# Patient Record
Sex: Male | Born: 1965 | Hispanic: No | Marital: Married | Smoking: Never smoker
Health system: Southern US, Community
[De-identification: ages and names within clinical notes are randomized; demographics above are authoritative.]

## PROBLEM LIST (undated history)

## (undated) DIAGNOSIS — C61 Malignant neoplasm of prostate: Secondary | ICD-10-CM

## (undated) HISTORY — PX: OTHER SURGICAL HISTORY: SHX169

---

## 2011-12-13 DIAGNOSIS — J189 Pneumonia, unspecified organism: Secondary | ICD-10-CM | POA: Insufficient documentation

## 2014-11-19 DIAGNOSIS — E785 Hyperlipidemia, unspecified: Secondary | ICD-10-CM | POA: Insufficient documentation

## 2015-01-09 HISTORY — PX: UPPER GASTROINTESTINAL ENDOSCOPY: SHX188

## 2015-01-27 DIAGNOSIS — R5383 Other fatigue: Secondary | ICD-10-CM | POA: Insufficient documentation

## 2015-01-30 DIAGNOSIS — K259 Gastric ulcer, unspecified as acute or chronic, without hemorrhage or perforation: Secondary | ICD-10-CM | POA: Insufficient documentation

## 2015-03-11 DIAGNOSIS — G4730 Sleep apnea, unspecified: Secondary | ICD-10-CM

## 2015-03-11 HISTORY — DX: Sleep apnea, unspecified: G47.30

## 2015-04-01 DIAGNOSIS — G4733 Obstructive sleep apnea (adult) (pediatric): Secondary | ICD-10-CM | POA: Insufficient documentation

## 2015-10-11 DIAGNOSIS — Z87442 Personal history of urinary calculi: Secondary | ICD-10-CM

## 2015-10-11 HISTORY — DX: Personal history of urinary calculi: Z87.442

## 2016-12-08 HISTORY — PX: COLONOSCOPY: SHX174

## 2016-12-26 DIAGNOSIS — N401 Enlarged prostate with lower urinary tract symptoms: Secondary | ICD-10-CM | POA: Insufficient documentation

## 2017-06-08 DIAGNOSIS — M545 Low back pain, unspecified: Secondary | ICD-10-CM | POA: Insufficient documentation

## 2017-10-10 HISTORY — PX: UPPER GASTROINTESTINAL ENDOSCOPY: SHX188

## 2017-11-04 DIAGNOSIS — R109 Unspecified abdominal pain: Secondary | ICD-10-CM | POA: Insufficient documentation

## 2017-11-08 DIAGNOSIS — K8020 Calculus of gallbladder without cholecystitis without obstruction: Secondary | ICD-10-CM | POA: Insufficient documentation

## 2017-11-08 DIAGNOSIS — N182 Chronic kidney disease, stage 2 (mild): Secondary | ICD-10-CM | POA: Insufficient documentation

## 2018-02-07 DIAGNOSIS — G5621 Lesion of ulnar nerve, right upper limb: Secondary | ICD-10-CM | POA: Insufficient documentation

## 2018-08-20 DIAGNOSIS — F5101 Primary insomnia: Secondary | ICD-10-CM | POA: Insufficient documentation

## 2018-08-28 DIAGNOSIS — M5136 Other intervertebral disc degeneration, lumbar region: Secondary | ICD-10-CM | POA: Insufficient documentation

## 2018-10-23 DIAGNOSIS — M25561 Pain in right knee: Secondary | ICD-10-CM | POA: Insufficient documentation

## 2019-11-01 DIAGNOSIS — F321 Major depressive disorder, single episode, moderate: Secondary | ICD-10-CM | POA: Insufficient documentation

## 2019-12-03 DIAGNOSIS — F419 Anxiety disorder, unspecified: Secondary | ICD-10-CM | POA: Insufficient documentation

## 2019-12-06 DIAGNOSIS — I2510 Atherosclerotic heart disease of native coronary artery without angina pectoris: Secondary | ICD-10-CM | POA: Insufficient documentation

## 2020-01-02 DIAGNOSIS — R490 Dysphonia: Secondary | ICD-10-CM | POA: Insufficient documentation

## 2020-01-02 DIAGNOSIS — R634 Abnormal weight loss: Secondary | ICD-10-CM | POA: Insufficient documentation

## 2020-01-02 DIAGNOSIS — R070 Pain in throat: Secondary | ICD-10-CM | POA: Insufficient documentation

## 2020-01-02 DIAGNOSIS — R630 Anorexia: Secondary | ICD-10-CM | POA: Insufficient documentation

## 2020-01-02 DIAGNOSIS — R0989 Other specified symptoms and signs involving the circulatory and respiratory systems: Secondary | ICD-10-CM | POA: Insufficient documentation

## 2020-01-02 DIAGNOSIS — J384 Edema of larynx: Secondary | ICD-10-CM | POA: Insufficient documentation

## 2020-01-02 DIAGNOSIS — R0600 Dyspnea, unspecified: Secondary | ICD-10-CM | POA: Insufficient documentation

## 2020-01-02 DIAGNOSIS — J383 Other diseases of vocal cords: Secondary | ICD-10-CM | POA: Insufficient documentation

## 2020-05-15 DIAGNOSIS — R972 Elevated prostate specific antigen [PSA]: Secondary | ICD-10-CM | POA: Insufficient documentation

## 2021-03-26 ENCOUNTER — Other Ambulatory Visit: Payer: Self-pay

## 2021-03-26 ENCOUNTER — Ambulatory Visit (INDEPENDENT_AMBULATORY_CARE_PROVIDER_SITE_OTHER): Payer: 59 | Admitting: Podiatry

## 2021-03-26 ENCOUNTER — Ambulatory Visit (INDEPENDENT_AMBULATORY_CARE_PROVIDER_SITE_OTHER): Payer: 59

## 2021-03-26 DIAGNOSIS — M795 Residual foreign body in soft tissue: Secondary | ICD-10-CM

## 2021-03-26 DIAGNOSIS — L02612 Cutaneous abscess of left foot: Secondary | ICD-10-CM

## 2021-03-26 DIAGNOSIS — N182 Chronic kidney disease, stage 2 (mild): Secondary | ICD-10-CM

## 2021-03-26 MED ORDER — CIPROFLOXACIN HCL 500 MG PO TABS: 500.0000 mg | ORAL_TABLET | Freq: Two times a day (BID) | ORAL | 0 refills | Status: AC

## 2021-03-26 MED ORDER — DOXYCYCLINE HYCLATE 100 MG PO TABS: 100.0000 mg | ORAL_TABLET | Freq: Two times a day (BID) | ORAL | 0 refills | Status: AC

## 2021-03-31 ENCOUNTER — Encounter: Payer: Self-pay | Admitting: Podiatry

## 2021-03-31 ENCOUNTER — Ambulatory Visit: Payer: 59 | Admitting: Podiatry

## 2021-03-31 NOTE — Progress Notes (Signed)
Subjective:  Patient ID: Samuel Patrick, male    DOB: [birth year],  MRN: [id number]  Chief Complaint  Patient presents with   Foreign Body    Left plantar forefoot foreign body. Pt states duration 1 week, painful to walk. Pt has had a previous dr visit where a piece of glass or plastic was removed.    55 y.o. male presents with the above complaint.  Patient presents with a new complaint of left plantar forefoot foreign body.  Patient states been going for 1 week is painful to walk.  Patient went to urgent care where there was a piece of glass/plastic removed.  No drainage.  It is still very painful and he would like to get it evaluated make sure that there is nothing left in the foot.  He has not been on any antibiotics.  He denies any systemic signs of infection he has not seen any foot and ankle specialist.  He denies any other acute complaints.   Review of Systems: Negative except as noted in the HPI. Denies N/V/F/Ch.  No past medical history on file.  Current Outpatient Medications:    albuterol (VENTOLIN HFA) 108 (90 Base) MCG/ACT inhaler, Inhale into the lungs., Disp: , Rfl:    amoxicillin (AMOXIL) 500 MG tablet, amoxicillin 500 mg tablet, Disp: , Rfl:    budesonide (PULMICORT FLEXHALER) 180 MCG/ACT inhaler, TAKE 1 PUFF BY MOUTH TWICE A DAY, Disp: , Rfl:    ciprofloxacin (CIPRO) 500 MG tablet, Take 1 tablet (500 mg total) by mouth 2 (two) times daily for 14 days., Disp: 28 tablet, Rfl: 0   cyclobenzaprine (FLEXERIL) 10 MG tablet, cyclobenzaprine 10 mg tablet, Disp: , Rfl:    diclofenac (VOLTAREN) 75 MG EC tablet, Take 75 mg by mouth 2 (two) times daily., Disp: , Rfl:    dicyclomine (BENTYL) 20 MG tablet, dicyclomine 20 mg tablet, Disp: , Rfl:    doxepin (SINEQUAN) 10 MG capsule, doxepin 10 mg capsule, Disp: , Rfl:    doxycycline (VIBRA-TABS) 100 MG tablet, Take 1 tablet (100 mg total) by mouth 2 (two) times daily for 14 days., Disp: 28 tablet, Rfl: 0   doxycycline (VIBRAMYCIN) 100 MG  capsule, Take 100 mg by mouth 2 (two) times daily., Disp: , Rfl:    escitalopram (LEXAPRO) 20 MG tablet, Take by mouth., Disp: , Rfl:    famotidine (PEPCID) 20 MG tablet, Take by mouth., Disp: , Rfl:    HYDROcodone-acetaminophen (NORCO/VICODIN) 5-325 MG tablet, hydrocodone 5 mg-acetaminophen 325 mg tablet  TAKE 1 TABLET EVERY 6 HOURS AS NEEDED FOR PAIN, Disp: , Rfl:    ibuprofen (ADVIL) 800 MG tablet, ibuprofen 800 mg tablet, Disp: , Rfl:    lidocaine (XYLOCAINE) 1 % (with preservative) injection, by Infiltration route., Disp: , Rfl:    meloxicam (MOBIC) 15 MG tablet, meloxicam 15 mg tablet, Disp: , Rfl:    Misc. Devices MISC, Start CPAP therapy at 13 cm. water pressure.  CPAP machine with a mask and supplies per patient preference for OSA.  ResMed AirFIt F30i wide full face mask., Disp: , Rfl:    pantoprazole (PROTONIX) 40 MG tablet, Take 1 tablet by mouth daily before breakfast., Disp: , Rfl:    predniSONE (DELTASONE) 10 MG tablet, Take 4 tablets for 3 days, Take 3 tablets for 3 days, Take 2 tablets for 3 days, Take 1 tablet for 3 days, Disp: , Rfl:    sildenafil (VIAGRA) 100 MG tablet, Take by mouth., Disp: , Rfl:    solifenacin (VESICARE) 10  MG tablet, Take 1 tablet by mouth daily., Disp: , Rfl:    Tamsulosin HCl (FLOMAX PO), Flomax, Disp: , Rfl:    triamcinolone acetonide (TRIESENCE) 40 MG/ML SUSP, Inject into the articular space., Disp: , Rfl:    zolpidem (AMBIEN) 10 MG tablet, zolpidem 10 mg tablet, Disp: , Rfl:   Social History   Tobacco Use  Smoking Status Not on file  Smokeless Tobacco Not on file    Allergies  Allergen Reactions   Hydromorphone Itching   Iodinated Diagnostic Agents Itching   Objective:  There were no vitals filed for this visit. There is no height or weight on file to calculate BMI. Constitutional Well developed. Well nourished.  Vascular Dorsalis pedis pulses palpable bilaterally. Posterior tibial pulses palpable bilaterally. Capillary refill normal to  all digits.  No cyanosis or clubbing noted. Pedal hair growth normal.  Neurologic Normal speech. Oriented to person, place, and time. Epicritic sensation to light touch grossly present bilaterally.  Dermatologic Puncture wound noted to the left plantar forefoot.  Pain on palpation to the puncture wound.  No erythema or redness noted.  No purulent drainage noted.  No other clinical signs of infection noted.  However superficial abscess may be clinically appreciated.  Orthopedic: Normal joint ROM without pain or crepitus bilaterally. No visible deformities. No bony tenderness.   Radiographs: 3 views discussion of mature adult left foot: No foreign body noted.  No other objects noted.  No bony abnormalities identified.  No soft tissue emphysema noted. Assessment:   1. Residual foreign body in soft tissue   2. Foot abscess, left   3. Stage 2 chronic kidney disease    Plan:  Patient was evaluated and treated and all questions answered.  Left plantar follow for fluid residual foreign body with underlying history of chronic kidney disease -Explained to the patient the etiology of foreign body and various treatment options were extensively discussed.  Given that there is a possibility he may still have a foreign body left in place and not a complete removal I believe patient will benefit from a procedure of removal of foreign body.  Clinically patient may have a superficial abscess that may have developed at the puncture site wound without being on antibiotics.  His tetanus has been updated. -I will place him on antibiotics doxycycline and Cipro given that the foreign body may have entered through the shoe.  I have asked him to complete the course of both antibiotics he states understanding.   Procedure removal of left foreign body residual -Skin was prepped with Betadine.  One-to-one percent lidocaine plain and quarter percent Marcaine plain was utilized 3 cc to provide local anesthesia.  Using  sharp surgical to, the entry point into the puncture wounds was debrided down.  At this point superficial purulent drainage was noted 1 cc.  No deep purulent drainage noted.  No further remanent foreign body noted.  No other clinical signs of infection noted.  At this time patient wound was dressed with triple antibiotic and a Band-Aid.  I have asked him to continue doing this for next 2 weeks.  He states understanding  No follow-ups on file.

## 2021-04-13 ENCOUNTER — Encounter (HOSPITAL_COMMUNITY): Payer: Self-pay | Admitting: Internal Medicine

## 2021-04-13 ENCOUNTER — Emergency Department (HOSPITAL_COMMUNITY): Payer: 59

## 2021-04-13 ENCOUNTER — Other Ambulatory Visit: Payer: Self-pay

## 2021-04-13 ENCOUNTER — Observation Stay (HOSPITAL_COMMUNITY)
Admission: EM | Admit: 2021-04-13 | Discharge: 2021-04-15 | DRG: 178 | Disposition: A | Discharge status: Home / Self Care | Payer: 59 | Attending: Internal Medicine | Admitting: Internal Medicine

## 2021-04-13 DIAGNOSIS — U071 COVID-19: Principal | ICD-10-CM

## 2021-04-13 DIAGNOSIS — E041 Nontoxic single thyroid nodule: Secondary | ICD-10-CM

## 2021-04-13 DIAGNOSIS — R531 Weakness: Secondary | ICD-10-CM

## 2021-04-13 DIAGNOSIS — K219 Gastro-esophageal reflux disease without esophagitis: Secondary | ICD-10-CM | POA: Diagnosis present

## 2021-04-13 DIAGNOSIS — R0602 Shortness of breath: Secondary | ICD-10-CM | POA: Diagnosis not present

## 2021-04-13 DIAGNOSIS — N182 Chronic kidney disease, stage 2 (mild): Secondary | ICD-10-CM | POA: Insufficient documentation

## 2021-04-13 DIAGNOSIS — N401 Enlarged prostate with lower urinary tract symptoms: Secondary | ICD-10-CM

## 2021-04-13 DIAGNOSIS — R338 Other retention of urine: Secondary | ICD-10-CM

## 2021-04-13 DIAGNOSIS — R0789 Other chest pain: Secondary | ICD-10-CM | POA: Diagnosis present

## 2021-04-13 DIAGNOSIS — G8194 Hemiplegia, unspecified affecting left nondominant side: Secondary | ICD-10-CM | POA: Insufficient documentation

## 2021-04-13 DIAGNOSIS — R079 Chest pain, unspecified: Secondary | ICD-10-CM | POA: Diagnosis present

## 2021-04-13 DIAGNOSIS — K5792 Diverticulitis of intestine, part unspecified, without perforation or abscess without bleeding: Secondary | ICD-10-CM

## 2021-04-13 LAB — BASIC METABOLIC PANEL
Anion gap: 8 (ref 5–15)
BUN: 15 mg/dL (ref 6–20)
CO2: 21 mmol/L — ABNORMAL LOW (ref 22–32)
Calcium: 9.3 mg/dL (ref 8.9–10.3)
Chloride: 109 mmol/L (ref 98–111)
Creatinine, Ser: 1.37 mg/dL — ABNORMAL HIGH (ref 0.61–1.24)
GFR, Estimated: >60 mL/min (ref >60)
Glucose, Bld: 104 mg/dL — ABNORMAL HIGH (ref 70–99)
Potassium: 3.7 mmol/L (ref 3.5–5.1)
Sodium: 138 mmol/L (ref 135–145)

## 2021-04-13 LAB — CBC
HCT: 45.6 % (ref 39.0–52.0)
Hemoglobin: 14 g/dL (ref 13.0–17.0)
MCH: 24.1 pg — ABNORMAL LOW (ref 26.0–34.0)
MCHC: 30.7 g/dL (ref 30.0–36.0)
MCV: 78.6 fL — ABNORMAL LOW (ref 80.0–100.0)
Platelets: 249 10*3/uL (ref 150–400)
RBC: 5.8 MIL/uL (ref 4.22–5.81)
RDW: 15.9 % — ABNORMAL HIGH (ref 11.5–15.5)
WBC: 5.7 10*3/uL (ref 4.0–10.5)
nRBC: 0 % (ref 0.0–0.2)

## 2021-04-13 LAB — PHOSPHORUS: Phosphorus: 3.5 mg/dL (ref 2.5–4.6)

## 2021-04-13 LAB — HEPATIC FUNCTION PANEL
ALT: 22 U/L (ref 0–44)
AST: 20 U/L (ref 15–41)
Albumin: 3.8 g/dL (ref 3.5–5.0)
Alkaline Phosphatase: 66 U/L (ref 38–126)
Bilirubin, Direct: <0.1 mg/dL (ref 0.0–0.2)
Total Bilirubin: 0.8 mg/dL (ref 0.3–1.2)
Total Protein: 7.3 g/dL (ref 6.5–8.1)

## 2021-04-13 LAB — TROPONIN I (HIGH SENSITIVITY)
Troponin I (High Sensitivity): 4 ng/L (ref <18)
Troponin I (High Sensitivity): 3 ng/L (ref <18)

## 2021-04-13 LAB — MAGNESIUM: Magnesium: 2.1 mg/dL (ref 1.7–2.4)

## 2021-04-13 LAB — HIV ANTIBODY (ROUTINE TESTING W REFLEX): HIV Screen 4th Generation wRfx: NONREACTIVE

## 2021-04-13 LAB — SARS CORONAVIRUS 2 (TAT 6-24 HRS): SARS Coronavirus 2: POSITIVE — AB

## 2021-04-13 LAB — LIPASE, BLOOD: Lipase: 35 U/L (ref 11–51)

## 2021-04-13 MED ORDER — NITROGLYCERIN 0.4 MG SL SUBL
0.4000 mg | SUBLINGUAL_TABLET | Freq: Once | SUBLINGUAL | Status: AC
  Administered 2021-04-13: 0.4 mg via SUBLINGUAL
  Filled 2021-04-13: qty 1

## 2021-04-13 MED ORDER — FAMOTIDINE 20 MG PO TABS
10.0000 mg | ORAL_TABLET | Freq: Every day | ORAL | Status: DC
  Administered 2021-04-13 – 2021-04-15 (×3): 10 mg via ORAL
  Filled 2021-04-13 (×3): qty 1

## 2021-04-13 MED ORDER — DIPHENHYDRAMINE HCL 50 MG/ML IJ SOLN
25.0000 mg | Freq: Once | INTRAMUSCULAR | Status: AC
  Administered 2021-04-13: 25 mg via INTRAVENOUS
  Filled 2021-04-13: qty 1

## 2021-04-13 MED ORDER — FENTANYL CITRATE (PF) 100 MCG/2ML IJ SOLN
50.0000 ug | Freq: Once | INTRAMUSCULAR | Status: AC
  Administered 2021-04-13: 50 ug via INTRAVENOUS
  Filled 2021-04-13: qty 2

## 2021-04-13 MED ORDER — POLYETHYLENE GLYCOL 3350 17 G PO PACK: 17.0000 g | PACK | Freq: Every day | ORAL | Status: DC | PRN

## 2021-04-13 MED ORDER — ACETAMINOPHEN 325 MG PO TABS
650.0000 mg | ORAL_TABLET | Freq: Four times a day (QID) | ORAL | Status: DC | PRN
  Administered 2021-04-13 – 2021-04-15 (×5): 650 mg via ORAL
  Filled 2021-04-13 (×5): qty 2

## 2021-04-13 MED ORDER — LIDOCAINE 5 % EX PTCH
1.0000 | MEDICATED_PATCH | CUTANEOUS | Status: DC
  Administered 2021-04-13 – 2021-04-15 (×3): 1 via TRANSDERMAL
  Filled 2021-04-13 (×4): qty 1

## 2021-04-13 MED ORDER — ONDANSETRON HCL 4 MG PO TABS
4.0000 mg | ORAL_TABLET | Freq: Three times a day (TID) | ORAL | Status: DC | PRN
  Administered 2021-04-13 – 2021-04-14 (×2): 4 mg via ORAL
  Filled 2021-04-13 (×2): qty 1

## 2021-04-13 MED ORDER — ALUM & MAG HYDROXIDE-SIMETH 200-200-20 MG/5ML PO SUSP
30.0000 mL | Freq: Once | ORAL | Status: AC
  Administered 2021-04-13: 30 mL via ORAL
  Filled 2021-04-13: qty 30

## 2021-04-13 MED ORDER — ONDANSETRON HCL 4 MG/2ML IJ SOLN
4.0000 mg | Freq: Once | INTRAMUSCULAR | Status: AC
  Administered 2021-04-13: 4 mg via INTRAVENOUS
  Filled 2021-04-13: qty 2

## 2021-04-13 MED ORDER — SODIUM CHLORIDE 0.9 % IV BOLUS
500.0000 mL | Freq: Once | INTRAVENOUS | Status: AC
  Administered 2021-04-13: 500 mL via INTRAVENOUS

## 2021-04-13 MED ORDER — SODIUM CHLORIDE 0.9 % IV SOLN
2.0000 g | Freq: Once | INTRAVENOUS | Status: AC
  Administered 2021-04-13: 2 g via INTRAVENOUS
  Filled 2021-04-13: qty 20

## 2021-04-13 MED ORDER — SODIUM CHLORIDE 0.9 % IV SOLN
12.5000 mg | Freq: Once | INTRAVENOUS | Status: AC
  Administered 2021-04-13: 12.5 mg via INTRAVENOUS
  Filled 2021-04-13: qty 0.5

## 2021-04-13 MED ORDER — IOHEXOL 350 MG/ML SOLN
100.0000 mL | Freq: Once | INTRAVENOUS | Status: AC | PRN
  Administered 2021-04-13: 100 mL via INTRAVENOUS

## 2021-04-13 MED ORDER — ALUM & MAG HYDROXIDE-SIMETH 200-200-20 MG/5ML PO SUSP
30.0000 mL | ORAL | Status: DC | PRN
  Administered 2021-04-13 – 2021-04-15 (×3): 30 mL via ORAL
  Filled 2021-04-13 (×3): qty 30

## 2021-04-13 MED ORDER — METRONIDAZOLE 500 MG/100ML IV SOLN
500.0000 mg | Freq: Once | INTRAVENOUS | Status: AC
  Administered 2021-04-13: 500 mg via INTRAVENOUS
  Filled 2021-04-13: qty 100

## 2021-04-13 MED ORDER — ACETAMINOPHEN 650 MG RE SUPP: 650.0000 mg | Freq: Four times a day (QID) | RECTAL | Status: DC | PRN

## 2021-04-13 MED ORDER — LIDOCAINE VISCOUS HCL 2 % MT SOLN
15.0000 mL | Freq: Once | OROMUCOSAL | Status: AC
  Administered 2021-04-13: 15 mL via ORAL
  Filled 2021-04-13: qty 15

## 2021-04-13 MED ORDER — SODIUM CHLORIDE 0.9% FLUSH
3.0000 mL | Freq: Two times a day (BID) | INTRAVENOUS | Status: DC
  Administered 2021-04-13 – 2021-04-15 (×5): 3 mL via INTRAVENOUS

## 2021-04-13 NOTE — ED Notes (Signed)
Pt requesting pain meds other than Tylenol for 9/10 abd/chest pain. MD IMTs paged.

## 2021-04-13 NOTE — ED Notes (Signed)
Pt report worsening in chest pain that irradiates to back between shoulders. MD and RN notified

## 2021-04-13 NOTE — ED Notes (Signed)
Pt states having extreme chest pain. Rates pain a 10 center of chest to left arm jaw.. pt states feeling anxious. ekg preformed given to Dr. Stevie Kern. Ardine Eng made aware

## 2021-04-13 NOTE — Progress Notes (Signed)
Patient arrived from the ED on a hospital bed, assessment completed see flow sheet placed on tele ccmd notified, patient oriented to room and staff, bed in lowest position call bell within reach will continue to monitor.

## 2021-04-13 NOTE — Discharge Instructions (Addendum)
You were admitted to the hospital for stomach pain and COVID-19 infection. Your stomach pain was found not to be related to your heart and was likely due to having COVID. You developed weakness when you presented here, but the imaging of your head and brain did not reveal findings of a stroke. You had a thyroid nodule that was discovered on imaging, but this was found to be normal and does not require any other workup. We ask that you continue to stay off your Protonix until further discussion with your primary care doctor. The ultrasound of your kidneys was also normal.   For your COVID infection, we ask that you quarantine at home until 04/19/2021. On 04/19/21, we advise that you wear a well-fitting mask when around others or when out in public until 04/24/2021.  Once you recover from your COVID infection after your isolation period, follow-up with your primary care provider.

## 2021-04-13 NOTE — H&P (Addendum)
Date: 04/13/2021               Patient Name:  Samuel Patrick MRN: [id number]  DOB: November 18, 1965 Age / Sex: 55 y.o., male   PCP: Pcp, No         Medical Service: Internal Medicine Teaching Service         Attending Physician: Dr. Reymundo Poll, MD    First Contact: Dr. Alroy Bailiff Pager: [phone]  Second Contact: Thurmon Fair, MD Pager: Cecilie Kicks [phone]         After Hours (After 5p/  First Contact Pager: [phone]  weekends / holidays): Second Contact Pager: [phone]   SUBJECTIVE   Chief Complaint: Atypical chest pain   History of Present Illness: Samuel Patrick is a 55 y.o. male with a pertinent PMH of anxiety, OSA, CKD, benign prostatic hyperplasia, sickle cell trait, who presents to [facility] with no acute onset central chest pain. Patient was concerned that his pain was form his heart, which precipitated his ED visit.  Patient states that he woke up this morning with acute onset epigastric pain and central chest pressure.  Patient states that the pain has been constant since he woke up without any waxing or waning. He has some left neck pain that radiated down the back of his left arm and some associated shortness of breath and dizziness when he got up to use the restroom this morning. He endorses a roughly 1 week history of  mild non-productive cough with significant fatigue without fevers, chills, or recent travel. His wife has been dealing with a "cold" which consists of productive cough and nasal congestion. They have both taken a COVID test daily for the past two that's which were negative. He did however, recently receive antibiotics for a foot infection from his podiatrist.  He has been taking Cipro and doxycycline for the past week without any side effects. He also endorses some nausea, and one episode of bloody emesis this morning. He has notice over the past few months worsening of his GERD symptoms, including epigastric pain with reflux, burning chest pain when he lays down at night, and  decreased po intake. He denies change in his abdominal pain with food, but stats that foods just don't taste the same as they use to. He had an EGD back in 2014 and states it was normal and has not been on any anti-acid meds for the past several months.Furthermore, he denies ETOH or NSAID use or a past history of PUD.  He denies any constipation, hematochezia, or diarrhea and his las colonoscopy was in 2018 which show a single sessile polyp that was removed.   In the ED, patient was hemodynamically stable with persistent chest pain despite nitro, 2x negative troponin, and reassuring EKGs. He had a CTA which did not show any acute cardiopulmonary issues, but did show some possible diverticulitis. As a result the patient was started on ceftriaxone and flagyl and admitted for observation of his chest pain.   Medications: Flomax Zolpidem  Ciprofloxacin  Doxycycline   Past Medical History:  No past medical history on file.  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   OBJECTIVE:   Physical Exam: Blood pressure 120/81, pulse 73, temperature 98.7 F (37.1 C), resp. rate 20, SpO2 98 %. Physical Exam Constitutional:      General: He is in acute distress.     Appearance: He is well-developed. He is obese. He is not ill-appearing or diaphoretic.  HENT:     Head: Normocephalic and atraumatic.     Mouth/Throat:     Mouth: Mucous membranes are dry.     Pharynx: Oropharynx is clear.  Eyes:     Extraocular Movements: Extraocular movements intact.   Cardiovascular:     Rate and Rhythm: Normal rate and regular rhythm.     Pulses: Normal pulses.     Comments: Patient did has faint heart sounds presumably due to body habitus, but no obvious murmur or gallops. Chest wall tenderness to palpation at the costochondral junction. Pulmonary:     Effort: Pulmonary effort is normal. No respiratory distress.     Breath sounds: Normal breath sounds. No wheezing or rales.  Abdominal:     General: Bowel sounds are normal. There is distension.     Palpations: Abdomen is soft. There is no mass.     Tenderness: There is abdominal tenderness (RUQ and epigastric TTP). There is guarding. There is no right CVA tenderness, left CVA tenderness or rebound.     Hernia: No hernia is present.  Musculoskeletal:        General: No swelling (no LE swelling). Normal range of motion.     Cervical back: Normal range of motion. Tenderness (TTP to the left neck without change in rang of motion) present. No rigidity.     Comments: No obvious foot lesions  Skin:    General: Skin is warm and dry.  Neurological:     General: No focal deficit present.     Mental Status: He is alert and oriented to person, place, and time. Mental status is at baseline.  Psychiatric:        Mood and Affect: Mood normal.    Labs: CBC    Component Value Date/Time   WBC 5.7 04/13/2021 0730   RBC 5.80 04/13/2021 0730   HGB 14.0 04/13/2021 0730   HCT 45.6 04/13/2021 0730   PLT 249 04/13/2021 0730   MCV 78.6 (L) 04/13/2021 0730   MCH 24.1 (L) 04/13/2021 0730   MCHC 30.7 04/13/2021 0730   RDW 15.9 (H) 04/13/2021 0730     CMP     Component Value Date/Time   NA 138 04/13/2021 0730   K 3.7 04/13/2021 0730   CL 109 04/13/2021 0730   CO2 21 (L) 04/13/2021 0730   GLUCOSE 104 (H) 04/13/2021 0730   BUN 15 04/13/2021 0730   CREATININE 1.37 (H) 04/13/2021 0730   CALCIUM 9.3 04/13/2021 0730   PROT 7.3 04/13/2021 0730   ALBUMIN 3.8 04/13/2021 0730   AST 20 04/13/2021 0730   ALT 22  04/13/2021 0730   ALKPHOS 66 04/13/2021 0730   BILITOT 0.8 04/13/2021 0730   GFRNONAA >60 04/13/2021 0730    Imaging: DG Chest 2 View 1. Low lung volumes with mild bibasilar atelectasis. No acute cardiopulmonary disease identified.  2. Questionable pulmonary nodule left upper lobe. PA and lateral chest x-ray suggested for further evaluation. Results phoned by me to Dr. Madilyn Hook at 7:52 a.m. on 04/13/2021. Electronically Signed   By: Maisie Fus  Register   On: 04/13/2021 07:52   CT  Angio Chest/Abd/Pel for Dissection W and/or Wo Contrast 1. No evidence for thoracoabdominal aortic aneurysm or dissection. No periaortic edema or hemorrhage to suggest aortic leak.  2. No large central pulmonary embolus.  3. Left colonic diverticulosis with very subtle pericolonic edema/inflammation along the proximal sigmoid colon. Imaging features suggest diverticulitis without perforation or abscess.  4. 2.9 x 2.4 x 2.4 cm soft tissue nodule in the region of the left thoracic inlet, immediately caudal to the left thyroid lobe. Image quality in this region is degraded by beam hardening artifact, but the nodule probably arises from the inferior left thyroid lobe. Recommend thyroid US. (Ref: J Am Coll Radiol. 2015 Feb;12(2): 143-50).  5. Cholelithiasis.  6. Aortic Atherosclerosis   EKG: personally reviewed my interpretation is sinus rhythm with nonspecific/minimal J-point elevation. Has a Q-wave in lead III without previous to compare to.   ASSESSMENT & PLAN:   Active Problems:   Thyroid nodule   Atypical chest pain   Nanine MeansFreddie Guallpa is a 55 y.o. with pertinent PMH of anxiety, OSA, CKD, benign prostatic hyperplasia, sickle cell trait who presented with acute onset chest pain and admit for atypical versus noncardiac chest pain on hospital day 0  #Noncardiac Chest: #Upper GI bleed: #GERD: Patient presents with acute onset chest pressure that is likely atypical cardiac versus noncardiac in origins. His chest pain  started without exertion or emotional disturbance. The pain has been constant since he woke up this morning that did not improve with nitro administration.  Furthermore, patients chest pressure is reproducible with palpation. He does have a history of prediabetes and mild elevation of his LDL at 110, but denies tobacco use or HTN. He does have a family history of heart disease in both of his parents. He has also had associated epigastric pain,1x episode of hematemesis, decreased appetite, with subjective worsening of his underlying GERD symptoms, which raises my concern for gastritis/PUD. Patient states that he had a EGD in 2014 which showed GERD/gastritis but has not been on PPIs for many months. He does endorse fatigue but his Hgb and BUN are WNL. He denies NSAID or ETOH use, which raises my concern for H pylori infection. Likely no emergent need for EGD at this time, but will likely need an out patient EGD in the near future. Alternatively, patient does have some CT scan evidence of possible diverticulitis. This is not consistent with his history of diarrhea or LLQ pain. Therefore, I will discontinue ceftriaxone and flagyl at this time.  - Monitor Hgb for signs of GI bleed - If he has another episode of hematemesis, will consult GI. - Will get stool H. Pylori antigen  - Will start Pepcid and Maalox for abdominal pain  - Will start Zofran for nausea - Continue to trend EKG - repeat EKG tomorrow morning.   #Costochondritis: Patient has chest tenderness to palpation that is likely MSK in nature. Therefore, likely costochondritis.  - Lidocaine patch as needed  #CKD (Stage 2) Has CKD but does not have any acute changes in kidney function  - Avoid nephrotoxic medication when possible.   OSA: He intermittently uses his CPAP. Will continue this will admitted.   Prediabetes: History of prediabetes with A1c of 5.8. He is not currently on medications  - CBG monitoring q morning.   #PAD: #HLD: Recent  CT scan shows aortic atherosclerosis and a total cholesterol of 159 and an LDL of 110 back in 05/2020. ASCVD of 3.2 to 4.6% - Continue to monitor in the OP setting.   #  Thyroid nodule Patient has CT imaging showing a possible thyroid nodule. Recent TSH was within normal limits will repeat TSH during this admission and consider a thyroid US.   #BPH: Patient has a history of BPH on Flomax.  He states that his worsening of his lower urinary tract symptoms including increased urination, dribbling, and frequent nocturia. -Continue Flomax -Get postvoid residual   Diet: Heart Healthy VTE: SCDs IVF: None,None Code: Full  Anticipated Discharge Location: Home Barriers to Discharge: work up   Dispo: Admit patient to Observation with expected length of stay less than 2 midnights.   Chari Manning, D.O.  Internal Medicine Resident, PGY-3 [facility]  Pager: [phone] 2:48 PM, 04/13/2021   Please contact the on call pager after 5 pm and on weekends at [phone].

## 2021-04-13 NOTE — Hospital Course (Addendum)
#  Symptomatic COVID-19 Infection #Hx of GERD and gastritis The pt presented to the ED on 07/05 with acute onset constant chest pressure and epigastric pain. ACS was ruled out initially with negative troponins x3, EKGs without ST changes. Found to be COVID + the night of his admission. Epigastric pain likely related to his COVID infection. This was managed with Tylenol, Maalox, Pepcid, and GI cocktail with lidocaine throughout his stay. Pt was briefly treated with simethicone and PPI, but these were discontinued prior to discharge in the setting of a possible protonix induced AIN.  #Acute onset left-sided weakness, unclear etiology On 07/06, patient endorsed new onset left-sided weakness in his LUE and LLE that started the day prior (07/05) when he was in the ED (he did not remember what time). He also reported new onset dysphagia at that time. Physical exam was remarkable for 4/5 strength in the LUE and 3/5 strength of the LLE as well as decreased sensation in the LUE and LLE. Subsequently initiated stroke work-up. CT head wo contrast and MRI brain were both unremarkable. Unclear etiology of the weakness, but will continue workup in the outpatient setting.   #CKD (Stage 2) #AKI Has CKD stage 2. Nephrotoxic medications were avoided when possible.  On 07/07, the patient developed an AKI. Held protonix and gave fluid challenge.  UA was unremarkable, and therefore had a low suspicion for PPI-induced AIN but held protonix at discharge. Renal ultrasound was ordered, and results were pending at discharge.  Prediabetes: A1c of 5.8. CBG's were monitored q morning throughout his stay.     #Thyroid nodule On the day of his admission, the patient had CT imaging showing a possible left thyroid nodule that was >1 cm in diameter. Repeat TSH obtained was normal. Obtained a thyroid US on 07/06 which was unremarkable with no further work-up needed.   PAD: HLD: Recent CT scan showed aortic atherosclerosis and a  total cholesterol of 159 and an LDL of 110 back in 05/2020. ASCVD of 3.2 to 4.6%. Will continue to monitor in the OP setting.   #BPH: History of BPH and continued on Flomax throughout his hospital stay. Patient initially reported worsening of his lower urinary tract symptoms including increased urination, dribbling, and frequent nocturia. PVR was borderline at 157 mL. Renal US was ordered to see if he was retaining, and these results were pending at discharge.  Patient's home medications were continued for his other comorbidities.

## 2021-04-13 NOTE — ED Notes (Signed)
Attempted report 

## 2021-04-13 NOTE — ED Provider Notes (Signed)
[facility] EMERGENCY DEPARTMENT Provider Note   CSN: [id number] Arrival date & time: 04/13/21  0725     History Chief Complaint  Patient presents with   Chest Pain    Samuel Patrick is a 55 y.o. male.  Presented to ER with concern for chest pain.  Patient reports noted pain this morning upon waking.  No symptoms at all yesterday.  States that his wife noticed that he was very sweaty in bed.  Patient states that he has had constant chest pain radiates to back, left shoulder.  Also having some associated nausea and one episode of vomiting.  Nonbloody bilious.  Also feels somewhat short of breath.  Has not had any significant exertion today.  All occurring at rest.  States both parents were diagnosed with coronary artery disease, unsure exact age of diagnosis.  Has history of CKD.  HPI     No past medical history on file.  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   Pain in right knee 10/23/2018   Lumbar degenerative disc disease 08/28/2018   Primary insomnia 08/20/2018   Neuritis of right ulnar nerve 02/07/2018   Calculus of gallbladder without cholecystitis without obstruction 11/08/2017   Stage 2 chronic kidney disease 11/08/2017   Abdominal pain 11/04/2017   Lumbar back pain 06/08/2017   Benign prostatic hyperplasia with lower urinary tract symptoms 12/26/2016   OSA (obstructive sleep apnea) 04/01/2015   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     No family history on file.     Home Medications Prior to Admission medications   Medication Sig Start Date End Date Taking? Authorizing Provider  albuterol (VENTOLIN HFA) 108 (90 Base) MCG/ACT inhaler Inhale into the lungs. 12/02/19   [provider]  amoxicillin (AMOXIL) 500 MG tablet amoxicillin 500 mg tablet    [provider]  budesonide (PULMICORT FLEXHALER) 180 MCG/ACT inhaler TAKE 1 PUFF BY MOUTH TWICE A DAY 09/10/20   [provider]  cyclobenzaprine (FLEXERIL) 10 MG tablet cyclobenzaprine 10 mg tablet    [provider]  diclofenac (VOLTAREN) 75 MG EC tablet Take 75 mg by mouth 2 (two) times daily. 01/06/21   [provider]  dicyclomine (BENTYL) 20 MG tablet dicyclomine 20 mg tablet 12/15/17   [provider]  doxepin (SINEQUAN) 10 MG capsule doxepin 10 mg capsule 05/18/20   [provider]  doxycycline (VIBRAMYCIN) 100 MG capsule Take 100 mg by mouth 2 (two) times daily. 12/31/20   [provider]  escitalopram (LEXAPRO) 20 MG tablet Take by mouth. 12/02/19   [provider]  famotidine (PEPCID) 20 MG tablet Take by mouth. 08/14/18   [provider]  HYDROcodone-acetaminophen (NORCO/VICODIN) 5-325 MG tablet hydrocodone 5 mg-acetaminophen 325 mg tablet  TAKE 1 TABLET EVERY 6 HOURS AS NEEDED FOR PAIN 12/06/19   [provider]  ibuprofen (ADVIL) 800 MG tablet ibuprofen 800 mg tablet    [provider]  lidocaine (XYLOCAINE) 1 % (with preservative) injection by Infiltration route. 01/06/21   [provider]  meloxicam (MOBIC) 15 MG tablet meloxicam 15 mg tablet    [provider]  Misc.  Devices MISC Start CPAP therapy at 13 cm. water pressure.  CPAP machine with a mask and supplies per patient preference for OSA.  ResMed AirFIt F30i wide full face mask. 03/20/20   [provider]  pantoprazole (PROTONIX) 40 MG tablet Take 1 tablet by mouth daily before breakfast. 10/09/19   [provider]  predniSONE (DELTASONE) 10 MG tablet Take 4 tablets for 3 days, Take 3 tablets for 3 days, Take 2 tablets for 3 days, Take 1 tablet for 3 days 01/01/20   [provider]  sildenafil (VIAGRA) 100 MG tablet Take by mouth. 05/15/20   [provider]  solifenacin (VESICARE) 10 MG tablet Take 1 tablet by mouth daily. 02/19/21   [provider]  Tamsulosin HCl (FLOMAX PO) Flomax    [provider]  triamcinolone acetonide (TRIESENCE) 40 MG/ML SUSP Inject into the articular space. 01/06/21   [provider]  zolpidem (AMBIEN) 10 MG tablet zolpidem 10 mg tablet 04/17/18   [provider]    Allergies    Hydromorphone and Iodinated diagnostic agents  Review of Systems   Review of Systems  Constitutional:  Negative for chills and fever.  HENT:  Negative for ear pain and sore throat.   Eyes:  Negative for pain and visual disturbance.  Respiratory:  Positive for chest tightness and shortness of breath. Negative for cough.   Cardiovascular:  Positive for chest pain. Negative for palpitations.  Gastrointestinal:  Negative for abdominal pain and vomiting.  Genitourinary:  Negative for dysuria and hematuria.  Musculoskeletal:  Positive for back pain. Negative for arthralgias.  Skin:  Negative for color change and rash.  Neurological:  Negative for seizures and syncope.  All other systems reviewed and are negative.  Physical Exam Updated Vital Signs BP (!) 164/84 (BP Location: Right Arm)   Pulse 100   Temp 98.7 F (37.1 C)   Resp 19   SpO2 100%   Physical Exam Vitals and nursing note reviewed.  Constitutional:      Comments:  Appears somewhat uncomfortable secondary to pain but not in frank distress  HENT:     Head: Normocephalic and atraumatic.  Eyes:     Conjunctiva/sclera: Conjunctivae normal.  Cardiovascular:     Rate and Rhythm: Normal rate and regular rhythm.     Heart sounds: No murmur heard. Pulmonary:     Effort: Pulmonary effort is normal. No respiratory distress.     Breath sounds: Normal breath sounds.  Abdominal:     Palpations: Abdomen is soft.     Tenderness: There is no abdominal tenderness.  Musculoskeletal:     Cervical back: Neck supple.     Right lower leg: No edema.     Left lower leg: No edema.  Skin:    General: Skin is warm and dry.     Capillary Refill: Capillary refill takes less than 2 seconds.  Neurological:     General: No focal deficit present.     Mental Status: He is alert.  Psychiatric:        Mood and Affect: Mood normal.  ED Results / Procedures / Treatments   Labs (all labs ordered are listed, but only abnormal results are displayed) Labs Reviewed  BASIC METABOLIC PANEL  CBC  TROPONIN I (HIGH SENSITIVITY)    EKG EKG Interpretation  Date/Time:  Tuesday April 13 2021 07:29:27 EDT Ventricular Rate:  95 PR Interval:  172 QRS Duration: 78 QT Interval:  334 QTC Calculation: 419 R Axis:   87 Text Interpretation: Normal sinus rhythm Nonspecific T wave abnormality Abnormal ECG Confirmed by Marianna Fuss ([id number]) on 04/13/2021 8:07:45 AM  Radiology DG Chest 2 View  Result Date: 04/13/2021 CLINICAL DATA:  Chest pain. EXAM: CHEST - 2 VIEW COMPARISON:  No prior FINDINGS: Mediastinum hilar structures normal. Heart size normal. Low lung volumes with mild bibasilar atelectasis. No focal infiltrate. Questionable tiny nodule left upper lobe. PA and lateral chest x-ray suggested for further evaluation. No pleural effusion or pneumothorax. No acute bony abnormality. IMPRESSION: 1. Low lung volumes with mild bibasilar atelectasis. No acute cardiopulmonary disease  identified. 2. Questionable pulmonary nodule left upper lobe. PA and lateral chest x-ray suggested for further evaluation. Results phoned by me to Dr. Madilyn Hook at 7:52 a.m. on 04/13/2021. Electronically Signed   By: Maisie Fus  Register   On: 04/13/2021 07:52   CT Angio Chest/Abd/Pel for Dissection W and/or Wo Contrast  Result Date: 04/13/2021 CLINICAL DATA:  Severe chest pain radiating to the back. Clinical concern for dissection. EXAM: CT ANGIOGRAPHY CHEST, ABDOMEN AND PELVIS TECHNIQUE: Non-contrast CT of the chest was initially obtained. Multidetector CT imaging through the chest, abdomen and pelvis was performed using the standard protocol during bolus administration of intravenous contrast. Multiplanar reconstructed images and MIPs were obtained and reviewed to evaluate the vascular anatomy. CONTRAST:  OMNIPAQUE IOHEXOL 350 MG/ML SOLN COMPARISON:  None. FINDINGS: CTA CHEST FINDINGS Cardiovascular: The heart size is normal. No substantial pericardial effusion. Coronary artery calcification is evident. Atherosclerotic calcification is noted in the wall of the thoracic aorta. No thoracic aortic aneurysm. Pre contrast imaging shows no hyperdense crescent in the wall of the thoracic aorta to suggest the presence of an acute intramural hematoma. Postcontrast imaging shows no dissection flap in the thoracic aorta. There is no large central pulmonary embolus in the pulmonary outflow tract or main pulmonary arteries. No lobar pulmonary embolus. Mediastinum/Nodes: No mediastinal lymphadenopathy. 2.9 x 2.4 x 2.4 cm soft tissue nodule is identified in the region of the left thoracic inlet, immediately caudal to the left thyroid lobe. Image quality in this region is degraded by beam hardening artifact from contrast material in the left subclavian vein, but the nodule probably arises from the inferior left thyroid lobe. There is no hilar lymphadenopathy. The esophagus has normal imaging features. There is no axillary  lymphadenopathy. Lungs/Pleura: No suspicious pulmonary nodule or mass. Subsegmental atelectasis noted in the lower lobes bilaterally. No focal airspace consolidation. There is no evidence of pleural effusion. Musculoskeletal: No worrisome lytic or sclerotic osseous abnormality. Review of the MIP images confirms the above findings. CTA ABDOMEN AND PELVIS FINDINGS VASCULAR Aorta: Normal caliber aorta without aneurysm, dissection, vasculitis or significant stenosis. Celiac: Patent without evidence of aneurysm, dissection, vasculitis or significant stenosis. SMA: Patent without evidence of aneurysm, dissection, vasculitis or significant stenosis. Renals: Both renal arteries are patent without evidence of aneurysm, dissection, vasculitis, fibromuscular dysplasia or significant stenosis. IMA: Patent without evidence of aneurysm, dissection, vasculitis or significant stenosis. Inflow: Patent without evidence of aneurysm, dissection, vasculitis or significant stenosis. Veins: No obvious venous abnormality within the limitations of this arterial  phase study. Review of the MIP images confirms the above findings. NON-VASCULAR Hepatobiliary: No suspicious focal abnormality within the liver parenchyma. Calcified gallstones evident measuring up to 19 mm. No intrahepatic or extrahepatic biliary dilation. Pancreas: No focal mass lesion. No dilatation of the main duct. No intraparenchymal cyst. No peripancreatic edema. Spleen: No splenomegaly. No focal mass lesion. Adrenals/Urinary Tract: No adrenal nodule or mass. Kidneys unremarkable. No evidence for hydroureter. The urinary bladder appears normal for the degree of distention. Stomach/Bowel: Stomach is unremarkable. No gastric wall thickening. No evidence of outlet obstruction. Duodenum is normally positioned as is the ligament of Treitz. No small bowel wall thickening. No small bowel dilatation. The terminal ileum is normal. The appendix is not well visualized, but there is no  edema or inflammation in the region of the cecum. No gross colonic mass. No colonic wall thickening. Diverticular changes noted left colon. There is some very subtle pericolonic edema/inflammation along the proximal sigmoid colon (images 235-239 of series 6). Lymphatic: There is no gastrohepatic or hepatoduodenal ligament lymphadenopathy. No retroperitoneal or mesenteric lymphadenopathy. No pelvic sidewall lymphadenopathy. Reproductive: Prostate gland is enlarged. Other: No intraperitoneal free fluid. Musculoskeletal: No worrisome lytic or sclerotic osseous abnormality. Review of the MIP images confirms the above findings. IMPRESSION: 1. No evidence for thoracoabdominal aortic aneurysm or dissection. No periaortic edema or hemorrhage to suggest aortic leak. 2. No large central pulmonary embolus. 3. Left colonic diverticulosis with very subtle pericolonic edema/inflammation along the proximal sigmoid colon. Imaging features suggest diverticulitis without perforation or abscess. 4. 2.9 x 2.4 x 2.4 cm soft tissue nodule in the region of the left thoracic inlet, immediately caudal to the left thyroid lobe. Image quality in this region is degraded by beam hardening artifact, but the nodule probably arises from the inferior left thyroid lobe. Recommend thyroid US. (Ref: J Am Coll Radiol. 2015 Feb;12(2): 143-50). 5. Cholelithiasis. 6. Aortic Atherosclerosis (ICD10-I70.0). Electronically Signed   By: Kennith CenterEric  Mansell M.D.   On: 04/13/2021 09:44    Procedures Procedures   Medications Ordered in ED Medications - No data to display  ED Course  I have reviewed the triage vital signs and the nursing notes.  Pertinent labs & imaging results that were available during my care of the patient were reviewed by me and considered in my medical decision making (see chart for details).  Clinical Course as of 04/13/21 1305  Tue Apr 13, 2021  1134 Called lab to check on repeat trop - still in process, will be done shortly [RD]     Clinical Course User Index [RD] Milagros Lollykstra, Maurice Fotheringham S, MD   MDM Rules/Calculators/A&P                         55 year old male presents to ER with concern for chest pain.  On exam patient appears uncomfortable from pain but not in frank distress.  Did have some generalized tenderness palpation in abdomen as well.  EKG without obvious ischemic change, troponin x2 within normal limits, given these findings I have a lower suspicion for acute coronary syndrome at present.  Given the severity of pain, radiation to back, check CTA and ruled out dissection.  Radiologist did comment on concern for diverticulitis.  Given ongoing pain, difficult to control symptoms, believe he would benefit from further observation.  Initiated antibiotics for possible diverticulitis.  Radiologist also incidentally noted thyroid nodule.  Will notify patient.  Discussed case with internal medicine teaching service who will admit patient.  Final  Clinical Impression(s) / ED Diagnoses Final diagnoses:  None    Rx / DC Orders ED Discharge Orders     None        Milagros Loll, MD 04/13/21 1308

## 2021-04-13 NOTE — ED Triage Notes (Signed)
Pt reports sudden onset of central chest pain with radiation to L shoulder since getting out of bed this morning. Endorses emesis x 1, diaphoresis, and shortness of breath. Pt appears very uncomfortable in triage.

## 2021-04-14 ENCOUNTER — Observation Stay (HOSPITAL_COMMUNITY): Payer: 59

## 2021-04-14 ENCOUNTER — Encounter (HOSPITAL_COMMUNITY): Payer: Self-pay | Admitting: Internal Medicine

## 2021-04-14 DIAGNOSIS — E041 Nontoxic single thyroid nodule: Secondary | ICD-10-CM

## 2021-04-14 DIAGNOSIS — K219 Gastro-esophageal reflux disease without esophagitis: Secondary | ICD-10-CM

## 2021-04-14 DIAGNOSIS — R0602 Shortness of breath: Secondary | ICD-10-CM | POA: Diagnosis not present

## 2021-04-14 DIAGNOSIS — U071 COVID-19: Secondary | ICD-10-CM | POA: Diagnosis not present

## 2021-04-14 DIAGNOSIS — R531 Weakness: Secondary | ICD-10-CM

## 2021-04-14 DIAGNOSIS — N182 Chronic kidney disease, stage 2 (mild): Secondary | ICD-10-CM | POA: Diagnosis not present

## 2021-04-14 DIAGNOSIS — G8194 Hemiplegia, unspecified affecting left nondominant side: Secondary | ICD-10-CM | POA: Diagnosis not present

## 2021-04-14 LAB — CBC
HCT: 41.1 % (ref 39.0–52.0)
Hemoglobin: 13 g/dL (ref 13.0–17.0)
MCH: 24.1 pg — ABNORMAL LOW (ref 26.0–34.0)
MCHC: 31.6 g/dL (ref 30.0–36.0)
MCV: 76.3 fL — ABNORMAL LOW (ref 80.0–100.0)
Platelets: 218 10*3/uL (ref 150–400)
RBC: 5.39 MIL/uL (ref 4.22–5.81)
RDW: 15.7 % — ABNORMAL HIGH (ref 11.5–15.5)
WBC: 4.6 10*3/uL (ref 4.0–10.5)
nRBC: 0 % (ref 0.0–0.2)

## 2021-04-14 LAB — COMPREHENSIVE METABOLIC PANEL
ALT: 20 U/L (ref 0–44)
AST: 19 U/L (ref 15–41)
Albumin: 3.3 g/dL — ABNORMAL LOW (ref 3.5–5.0)
Alkaline Phosphatase: 57 U/L (ref 38–126)
Anion gap: 4 — ABNORMAL LOW (ref 5–15)
BUN: 9 mg/dL (ref 6–20)
CO2: 26 mmol/L (ref 22–32)
Calcium: 9 mg/dL (ref 8.9–10.3)
Chloride: 108 mmol/L (ref 98–111)
Creatinine, Ser: 1.49 mg/dL — ABNORMAL HIGH (ref 0.61–1.24)
GFR, Estimated: 55 mL/min — ABNORMAL LOW (ref >60)
Glucose, Bld: 111 mg/dL — ABNORMAL HIGH (ref 70–99)
Potassium: 4 mmol/L (ref 3.5–5.1)
Sodium: 138 mmol/L (ref 135–145)
Total Bilirubin: 0.8 mg/dL (ref 0.3–1.2)
Total Protein: 6.3 g/dL — ABNORMAL LOW (ref 6.5–8.1)

## 2021-04-14 LAB — TROPONIN I (HIGH SENSITIVITY): Troponin I (High Sensitivity): 4 ng/L (ref <18)

## 2021-04-14 LAB — TSH: TSH: 1.74 u[IU]/mL (ref 0.350–4.500)

## 2021-04-14 LAB — LIPASE, BLOOD: Lipase: 42 U/L (ref 11–51)

## 2021-04-14 MED ORDER — SIMETHICONE 80 MG PO CHEW
80.0000 mg | CHEWABLE_TABLET | Freq: Three times a day (TID) | ORAL | Status: DC
  Administered 2021-04-14 (×3): 80 mg via ORAL
  Filled 2021-04-14 (×3): qty 1

## 2021-04-14 MED ORDER — LIDOCAINE VISCOUS HCL 2 % MT SOLN: 15.0000 mL | Freq: Once | OROMUCOSAL | Status: DC

## 2021-04-14 MED ORDER — LORAZEPAM 1 MG PO TABS: 2.0000 mg | ORAL_TABLET | Freq: Once | ORAL | Status: DC

## 2021-04-14 MED ORDER — LORAZEPAM 1 MG PO TABS
1.0000 mg | ORAL_TABLET | Freq: Once | ORAL | Status: AC
  Administered 2021-04-14: 1 mg via ORAL
  Filled 2021-04-14 (×2): qty 1

## 2021-04-14 MED ORDER — ONDANSETRON HCL 4 MG PO TABS: 4.0000 mg | ORAL_TABLET | Freq: Once | ORAL | Status: AC

## 2021-04-14 MED ORDER — HYDROXYZINE HCL 25 MG PO TABS: 25.0000 mg | ORAL_TABLET | ORAL | Status: DC | PRN

## 2021-04-14 MED ORDER — ZOLPIDEM TARTRATE 5 MG PO TABS
10.0000 mg | ORAL_TABLET | Freq: Every day | ORAL | Status: DC
  Filled 2021-04-14: qty 2

## 2021-04-14 MED ORDER — TAMSULOSIN HCL 0.4 MG PO CAPS
0.4000 mg | ORAL_CAPSULE | Freq: Every day | ORAL | Status: DC
  Administered 2021-04-14 – 2021-04-15 (×2): 0.4 mg via ORAL
  Filled 2021-04-14 (×2): qty 1

## 2021-04-14 MED ORDER — LIDOCAINE VISCOUS HCL 2 % MT SOLN
15.0000 mL | Freq: Once | OROMUCOSAL | Status: AC
  Administered 2021-04-14: 15 mL via ORAL
  Filled 2021-04-14: qty 15

## 2021-04-14 MED ORDER — ZOLPIDEM TARTRATE 5 MG PO TABS: 10.0000 mg | ORAL_TABLET | Freq: Every day | ORAL | Status: DC

## 2021-04-14 MED ORDER — TAMSULOSIN HCL 0.4 MG PO CAPS
0.4000 mg | ORAL_CAPSULE | Freq: Every day | ORAL | Status: DC
  Filled 2021-04-14: qty 1

## 2021-04-14 MED ORDER — ADULT MULTIVITAMIN W/MINERALS CH
1.0000 | ORAL_TABLET | Freq: Every day | ORAL | Status: DC
  Administered 2021-04-14 – 2021-04-15 (×2): 1 via ORAL
  Filled 2021-04-14 (×2): qty 1

## 2021-04-14 MED ORDER — PANTOPRAZOLE SODIUM 40 MG PO TBEC
40.0000 mg | DELAYED_RELEASE_TABLET | Freq: Every day | ORAL | Status: DC
  Administered 2021-04-14: 40 mg via ORAL
  Filled 2021-04-14: qty 1

## 2021-04-14 MED ORDER — ALUM & MAG HYDROXIDE-SIMETH 200-200-20 MG/5ML PO SUSP
30.0000 mL | Freq: Once | ORAL | Status: AC
  Administered 2021-04-14: 30 mL via ORAL
  Filled 2021-04-14: qty 30

## 2021-04-14 MED ORDER — ALUM & MAG HYDROXIDE-SIMETH 200-200-20 MG/5ML PO SUSP: 30.0000 mL | Freq: Once | ORAL | Status: DC

## 2021-04-14 MED ORDER — TAMSULOSIN HCL 0.4 MG PO CAPS: 0.4000 mg | ORAL_CAPSULE | Freq: Every day | ORAL | Status: DC

## 2021-04-14 MED ORDER — ZOLPIDEM TARTRATE 5 MG PO TABS
10.0000 mg | ORAL_TABLET | Freq: Every day | ORAL | Status: DC
  Administered 2021-04-14 (×2): 10 mg via ORAL
  Filled 2021-04-14: qty 2

## 2021-04-14 NOTE — Progress Notes (Signed)
RT note: Patient not wearing cpap QHS, COIV+ not in a negative pressure room.

## 2021-04-14 NOTE — Progress Notes (Signed)
Pt states he needs a letter stating he is hospitalized per lawyer because he has a court hearing today. IM resident paged. Awaiting response.  Kenard Gower, RN

## 2021-04-14 NOTE — Progress Notes (Addendum)
Paged at [phone] for increased chest pain, abdominal pain, and nausea.  Patient states that chest pain increased since being transferred from the ED to the floor.  Describes the pain as 10/10.  Earlier today pain radiated to left arm and neck, but now pain is radiating to epigastric area.  States that he has been passing gas and his last bowel movement was yesterday and was normal.  He has noticed increased urine output today.      Patient is COVID +ve.  He has noticed some increased shortness of breath today, but his oxygen saturation was at 99% on room air while I was in the room.   Repeat EKG with sinus rhythm and 1st degree AV Block at that time. Ordered repeat troponin, lipase, hemoglobin, as well as GI cocktail and Zofran PO (QT of 413 on repeat EKG).  Restarted home medications of Ambien and Flomax.

## 2021-04-14 NOTE — Progress Notes (Signed)
Initial Nutrition Assessment  DOCUMENTATION CODES:  Obesity unspecified  INTERVENTION:  Recommend removing carb modified restriction from diet order.  Add Magic cup TID with meals, each supplement provides 290 kcal and 9 grams of protein.  Add MVI with minerals daily.  NUTRITION DIAGNOSIS:  Inadequate oral intake related to decreased appetite, nausea as evidenced by per patient/family report, meal completion < 25%.  GOAL:  Patient will meet greater than or equal to 90% of their needs  MONITOR:  PO intake, Supplement acceptance, Labs, Weight trends, Skin, I & O's  REASON FOR ASSESSMENT:  Malnutrition Screening Tool    ASSESSMENT:  55 yo male with a PMH of anxiety, OSA, CKD, benign prostatic hyperplasia, prediabetes, GERD, and sickle cell trait who presented with acute onset chest pain and admit for atypical versus noncardiac chest pain.  RD working remotely. Pt in CT and out of room during RD's attempt to reach pt by room phone.  Per Epic, pt ate 25% of his dinner last night.  Pt with no weight history in Epic. Pt weighed 106.4 kg on 01/06/21 per Care Everywhere, however. Pt has lost ~9 lbs (4%) in the last 3 months, which is not necessarily significant for the time frame.  Recommend adding Magic Cup TID and MVI with minerals to aid in intake. Recommend removing Carb Mod restriction given that pt does not have diabetes.  Medications: reviewed; Pepcid, Protonix, Zofran PRN (given once today)  Labs: reviewed; Glucose 111 (H)  NUTRITION - FOCUSED PHYSICAL EXAM: Unable to perform  Diet Order:   Diet Order             Diet Heart Room service appropriate? Yes; Fluid consistency: Thin  Diet effective now                  EDUCATION NEEDS:  No education needs have been identified at this time  Skin:  Skin Assessment: Reviewed RN Assessment  Last BM:  04/12/21  Height:  Ht Readings from Last 1 Encounters:  04/13/21 5\' 7"  (1.702 m)   Weight:  Wt Readings from Last  1 Encounters:  04/14/21 102 kg   Ideal Body Weight:  67.3 kg  BMI:  Body mass index is 35.22 kg/m.  Estimated Nutritional Needs:  Kcal:  2000-2200 Protein:  100-115 grams Fluid:  >2 L  06/15/21, RD, LDN (she/her/hers) Registered Dietitian I After-Hours/Weekend Pager # in [phone]

## 2021-04-14 NOTE — Progress Notes (Addendum)
Subjective: Pt was seen at bedside this morning during rounds. He continues to report epigastric pain which has been worsening since he arrived in the ED. Pt states that the GI cocktail he received last night did not alleviate his symptoms. He also endorses continued nausea and loss of appetite but denies any vomiting since his admission. +Dysphagia. Last BM was 07/04. His main concern is new onset left-sided weakness in his LUE and LLE. He says the weakness started yesterday when he was in the ED (does not remember what time).  Pt states that he has had this weakness before. He was followed by Dr. Marisa Sprinkles at [facility] in 2013. Per chart review, the patient initially presented to [facility] in March 2013 with neck pain, numbness, and weakness on his left side. There was a concern for a thalamic stroke, but MRI of the brain was unremarkable. Also obtained MRI C-spine (unremarkable) and had nerve conduction studies of the left arm (unremarkable). Symptoms incompletely explained at the time; he was started on a tapering dose of prednisone. No other records found in regards to further workup.  Objective:  Vital signs in last 24 hours: Vitals:   04/14/21 0100 04/14/21 0200 04/14/21 0500 04/14/21 0900  BP: 120/83 126/76 125/77 135/86  Pulse: 64 70 74 74  Resp:    18  Temp:   (!) 97.3 F (36.3 C)   TempSrc:   Oral Oral  SpO2: 98% 97% 97% 100%  Weight:   102 kg   Height:       Physical Exam: General: Well-developed, well-nourished, in acute distress.  HEENT: Normocephalic and atraumatic Cardiovascular: Normal rate and rhythm, no m/r/g. No LE swelling Respiratory: CTAB, breathing comfortably on room air Abdominal: Soft, flat, mildly distended. Bowel sounds heard throughout. +TTP in RUQ and epigastric areas. +guarding MSK: No LE edema.  Skin: Warm and dry Psych: Normal mood and affect  Neurological Examination Mental Status: Alert, oriented, thought content appropriate. Speech fluent  without evidence of aphasia.  CNs:  II: Visual fields grossly normal,  III,IV, VI: ptosis not present, extra-ocular motions intact bilaterally VII: smile symmetric IX,X: uvula rises symmetrically XII: midline tongue extension Motor: Right : Upper extremity   5/5  Left:     Upper extremity   4/5  Lower extremity   5/5   Lower extremity   3/5 Tone and bulk:normal tone throughout; no atrophy noted   Labs: Results for orders placed or performed during the hospital encounter of 04/13/21 (from the past 24 hour(s))  HIV Antibody (routine testing w rflx)     Status: None   Collection Time: 04/13/21  1:29 PM  Result Value Ref Range   HIV Screen 4th Generation wRfx Non Reactive Non Reactive  Magnesium     Status: None   Collection Time: 04/13/21  1:29 PM  Result Value Ref Range   Magnesium 2.1 1.7 - 2.4 mg/dL  Phosphorus     Status: None   Collection Time: 04/13/21  1:29 PM  Result Value Ref Range   Phosphorus 3.5 2.5 - 4.6 mg/dL  Comprehensive metabolic panel     Status: Abnormal   Collection Time: 04/14/21  1:12 AM  Result Value Ref Range   Sodium 138 135 - 145 mmol/L   Potassium 4.0 3.5 - 5.1 mmol/L   Chloride 108 98 - 111 mmol/L   CO2 26 22 - 32 mmol/L   Glucose, Bld 111 (H) 70 - 99 mg/dL   BUN 9 6 - 20 mg/dL  Creatinine, Ser 1.49 (H) 0.61 - 1.24 mg/dL   Calcium 9.0 8.9 - 88.5 mg/dL   Total Protein 6.3 (L) 6.5 - 8.1 g/dL   Albumin 3.3 (L) 3.5 - 5.0 g/dL   AST 19 15 - 41 U/L   ALT 20 0 - 44 U/L   Alkaline Phosphatase 57 38 - 126 U/L   Total Bilirubin 0.8 0.3 - 1.2 mg/dL   GFR, Estimated 55 (L) >60 mL/min   Anion gap 4 (L) 5 - 15  CBC     Status: Abnormal   Collection Time: 04/14/21  1:12 AM  Result Value Ref Range   WBC 4.6 4.0 - 10.5 K/uL   RBC 5.39 4.22 - 5.81 MIL/uL   Hemoglobin 13.0 13.0 - 17.0 g/dL   HCT 02.7 74.1 - 28.7 %   MCV 76.3 (L) 80.0 - 100.0 fL   MCH 24.1 (L) 26.0 - 34.0 pg   MCHC 31.6 30.0 - 36.0 g/dL   RDW 86.7 (H) 67.2 - 09.4 %   Platelets 218 150 -  400 K/uL   nRBC 0.0 0.0 - 0.2 %  TSH     Status: None   Collection Time: 04/14/21  1:12 AM  Result Value Ref Range   TSH 1.740 0.350 - 4.500 uIU/mL  Troponin I (High Sensitivity)     Status: None   Collection Time: 04/14/21  1:12 AM  Result Value Ref Range   Troponin I (High Sensitivity) 4 <18 ng/L  Lipase, blood     Status: None   Collection Time: 04/14/21  1:12 AM  Result Value Ref Range   Lipase 42 11 - 51 U/L   H pylori stool antigen pending   Imaging: CT Head WO Contrast, 07/06: IMPRESSION: Normal head CT  MRI brain WO Contrast pending    Assessment/Plan:  Samuel Patrick is a 55 y.o. male with pertinent PMH of anxiety, OSA, CKD, benign prostatic hyperplasia, sickle cell trait who presented with acute onset chest pain and subsequently admitted on 04/13/21 for atypical versus noncardiac chest pain.  Active Problems:   GERD (gastroesophageal reflux disease)   Thyroid nodule   Atypical chest pain  #Symptomatic COVID-19 Infection  #Hx of GERD and gastritis The pt presented to the ED on 07/05 with acute onset constant chest pressure and epigastric pain. Unlikely cardiac in nature given negative troponins x3, EKGs without ST changes, and no symptom relief with nitro.  In addition to his epigastric pain, he has also had 1x episode of hematemesis, decreased appetite, with subjective worsening of his underlying GERD symptoms. Patient states that he had a EGD in 2014 which showed GERD/gastritis but has not been on PPIs for many months. No tobacco use, alcohol use, or NSAID use. He was found to be COVID + last night, which could have caused an acute gastritis. Also considering PUD on the differential and have ordered an H pylori stool antigen; however, his labs are not consistent with IDA. He is s/p GI cocktail with lidocaine without relief, so we have added a PPI and simethicone to his regimen and will continue to monitor.  - Monitor Hgb for signs of GI bleed - If he has another  episode of hematemesis, will consult GI. - H. Pylori stool antigen pending - Continue Tylenol, Pepcid regimens for abdominal pain; added PPI and simethicone regimens today. - Continue Zofran for nausea - Consider outpatient EGD in the near future  #Acute onset left-sided weakness Pt reports new onset left-sided weakness in his LUE and  LLE that started yesterday when he was in the ED (he does not remember what time). Denies right-sided weakness. +Dysphagia. He states that he has had this happen to him before in 2013 for which he was followed by Dr. Marisa Sprinkles at [facility]. There was initial suspicion for a thalamic stroke, however imaging studies performed at that time were unremarkable.  On physical exam today, he has 4/5 strength in the LUE and 3/5 strength of the LLE but is otherwise unremarkable. Outside of tPA window. NIH stroke scale 4. We have obtained CT head wo contrast today which was negative so have ordered an MRI brain wo contrast for further evaluation. Will continue to monitor closely.   - MRI brain wo contrast pending  #Costochondritis: Patient noted to have chest tenderness to palpation on prior exam that is likely MSK in nature. Therefore, likely costochondritis. - Lidocaine patch as needed   #CKD (Stage 2) Has CKD but does not have any acute changes in kidney function (GFR <60, Cr stable at 1.49). - Avoid nephrotoxic medication when possible.    OSA: He intermittently uses his CPAP. Will continue this while admitted.    Prediabetes: History of prediabetes with A1c of 5.8. He is not currently on medications - CBG monitoring q morning.    #PAD: #HLD: Recent CT scan shows aortic atherosclerosis and a total cholesterol of 159 and an LDL of 110 back in 05/2020. ASCVD of 3.2 to 4.6% - Continue to monitor in the OP setting.    #Thyroid nodule Patient has CT imaging showing a possible thyroid nodule that is >1 cm in diameter. Repeat TSH was normal. Will obtain thyroid US.  If concerning features on [place], next step would be an FNA (can be done outpatient) - Thyroid US pending   #BPH: Patient has a history of BPH on Flomax. Patient has reported worsening of his lower urinary tract symptoms including increased urination, dribbling, and frequent nocturia. -Continue Flomax regimen -PVR pending -Consider referral to Urology    Anticipated Discharge Location: Home Barriers to Discharge: work-up Dispo: Anticipated discharge in approximately 2-3 day(s).   Fredonia Highland, MD PGY-1 Internal Medicine  04/14/2021, 11:57 AM Pager: [phone] After 5pm on weekdays and 1pm on weekends: On Call pager [phone]

## 2021-04-14 NOTE — Progress Notes (Signed)
Pt is extremely claustro. Was given meds prior to MRI study. He was unable to go through exam. He wants study to be done under anesthesia. I told him that he would have to discuss this with his doctor and he acknowledged his understanding.

## 2021-04-14 NOTE — Progress Notes (Signed)
Masters, DO notified of patient's inability to undergo MRI study due to claustrophobia. Patient was given Lorazepam 1 mg PO prior to leaving the unit for MRI. Master's DO also notified of patient's request for MRI to be done under anesthesia.

## 2021-04-15 ENCOUNTER — Encounter (HOSPITAL_COMMUNITY)
Admission: EM | Disposition: A | Discharge status: Home / Self Care | Payer: Self-pay | Source: Home / Self Care | Attending: Internal Medicine

## 2021-04-15 ENCOUNTER — Inpatient Hospital Stay (HOSPITAL_COMMUNITY): Payer: 59

## 2021-04-15 DIAGNOSIS — R7303 Prediabetes: Secondary | ICD-10-CM

## 2021-04-15 DIAGNOSIS — N189 Chronic kidney disease, unspecified: Secondary | ICD-10-CM

## 2021-04-15 DIAGNOSIS — R079 Chest pain, unspecified: Secondary | ICD-10-CM | POA: Diagnosis present

## 2021-04-15 HISTORY — DX: Chronic kidney disease, unspecified: N18.9

## 2021-04-15 HISTORY — PX: RADIOLOGY WITH ANESTHESIA: SHX6223

## 2021-04-15 HISTORY — DX: Prediabetes: R73.03

## 2021-04-15 LAB — URINALYSIS, COMPLETE (UACMP) WITH MICROSCOPIC
Bacteria, UA: NONE SEEN
Bilirubin Urine: NEGATIVE
Glucose, UA: NEGATIVE mg/dL
Hgb urine dipstick: NEGATIVE
Ketones, ur: NEGATIVE mg/dL
Leukocytes,Ua: NEGATIVE
Nitrite: NEGATIVE
Protein, ur: NEGATIVE mg/dL
Specific Gravity, Urine: 1.011 (ref 1.005–1.030)
pH: 6 (ref 5.0–8.0)

## 2021-04-15 LAB — CBC
HCT: 41 % (ref 39.0–52.0)
Hemoglobin: 12.9 g/dL — ABNORMAL LOW (ref 13.0–17.0)
MCH: 24.1 pg — ABNORMAL LOW (ref 26.0–34.0)
MCHC: 31.5 g/dL (ref 30.0–36.0)
MCV: 76.6 fL — ABNORMAL LOW (ref 80.0–100.0)
Platelets: 212 10*3/uL (ref 150–400)
RBC: 5.35 MIL/uL (ref 4.22–5.81)
RDW: 15.5 % (ref 11.5–15.5)
WBC: 5.2 10*3/uL (ref 4.0–10.5)
nRBC: 0 % (ref 0.0–0.2)

## 2021-04-15 LAB — URINALYSIS, ROUTINE W REFLEX MICROSCOPIC
Bilirubin Urine: NEGATIVE
Glucose, UA: NEGATIVE mg/dL
Hgb urine dipstick: NEGATIVE
Ketones, ur: NEGATIVE mg/dL
Leukocytes,Ua: NEGATIVE
Nitrite: NEGATIVE
Protein, ur: NEGATIVE mg/dL
Specific Gravity, Urine: 1.011 (ref 1.005–1.030)
pH: 6 (ref 5.0–8.0)

## 2021-04-15 LAB — COMPREHENSIVE METABOLIC PANEL
ALT: 20 U/L (ref 0–44)
AST: 18 U/L (ref 15–41)
Albumin: 3.2 g/dL — ABNORMAL LOW (ref 3.5–5.0)
Alkaline Phosphatase: 61 U/L (ref 38–126)
Anion gap: 4 — ABNORMAL LOW (ref 5–15)
BUN: 15 mg/dL (ref 6–20)
CO2: 27 mmol/L (ref 22–32)
Calcium: 8.9 mg/dL (ref 8.9–10.3)
Chloride: 106 mmol/L (ref 98–111)
Creatinine, Ser: 1.75 mg/dL — ABNORMAL HIGH (ref 0.61–1.24)
GFR, Estimated: 46 mL/min — ABNORMAL LOW (ref >60)
Glucose, Bld: 122 mg/dL — ABNORMAL HIGH (ref 70–99)
Potassium: 3.5 mmol/L (ref 3.5–5.1)
Sodium: 137 mmol/L (ref 135–145)
Total Bilirubin: 0.7 mg/dL (ref 0.3–1.2)
Total Protein: 6.1 g/dL — ABNORMAL LOW (ref 6.5–8.1)

## 2021-04-15 LAB — GLUCOSE, CAPILLARY: Glucose-Capillary: 109 mg/dL — ABNORMAL HIGH (ref 70–99)

## 2021-04-15 SURGERY — MRI WITH ANESTHESIA
Anesthesia: Monitor Anesthesia Care

## 2021-04-15 MED ORDER — ALUM & MAG HYDROXIDE-SIMETH 200-200-20 MG/5ML PO SUSP
30.0000 mL | Freq: Three times a day (TID) | ORAL | Status: DC
  Administered 2021-04-15: 30 mL via ORAL
  Filled 2021-04-15: qty 30

## 2021-04-15 MED ORDER — FENTANYL CITRATE (PF) 100 MCG/2ML IJ SOLN
INTRAMUSCULAR | Status: DC | PRN
  Administered 2021-04-15: 25 ug via INTRAVENOUS

## 2021-04-15 MED ORDER — LACTATED RINGERS IV BOLUS
1000.0000 mL | Freq: Once | INTRAVENOUS | Status: AC
  Administered 2021-04-15: 1000 mL via INTRAVENOUS

## 2021-04-15 MED ORDER — LIDOCAINE VISCOUS HCL 2 % MT SOLN
15.0000 mL | Freq: Once | OROMUCOSAL | Status: DC
  Filled 2021-04-15: qty 15

## 2021-04-15 MED ORDER — ENOXAPARIN SODIUM 60 MG/0.6ML IJ SOSY
50.0000 mg | PREFILLED_SYRINGE | INTRAMUSCULAR | Status: DC
  Administered 2021-04-15: 50 mg via SUBCUTANEOUS
  Filled 2021-04-15: qty 0.6

## 2021-04-15 MED ORDER — MIDAZOLAM HCL 2 MG/2ML IJ SOLN
INTRAMUSCULAR | Status: DC | PRN
  Administered 2021-04-15: 2 mg via INTRAVENOUS

## 2021-04-15 MED ORDER — LACTATED RINGERS IV SOLN: INTRAVENOUS | Status: DC | PRN
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
## 2021-04-15 NOTE — Anesthesia Preprocedure Evaluation (Addendum)
Anesthesia Evaluation  Patient identified by MRN, date of birth, ID band Patient awake    Reviewed: Allergy & Precautions, NPO status , Patient's Chart, lab work & pertinent test results  Airway Mallampati: I  TM Distance: >3 FB Neck ROM: Full    Dental no notable dental hx. (+) Dental Advisory Given   Pulmonary shortness of breath, sleep apnea , pneumonia,    Pulmonary exam normal breath sounds clear to auscultation       Cardiovascular + CAD  Normal cardiovascular exam Rhythm:Regular Rate:Normal     Neuro/Psych PSYCHIATRIC DISORDERS Anxiety Depression  Neuromuscular disease    GI/Hepatic Neg liver ROS, PUD, GERD  ,  Endo/Other  negative endocrine ROS  Renal/GU Renal disease     Musculoskeletal  (+) Arthritis ,   Abdominal (+) + obese,   Peds  Hematology  (+) Blood dyscrasia, anemia ,   Anesthesia Other Findings   Reproductive/Obstetrics                            Anesthesia Physical Anesthesia Plan  ASA: 2  Anesthesia Plan: MAC   Post-op Pain Management:    Induction: Intravenous  PONV Risk Score and Plan: Ondansetron, Dexamethasone, Treatment may vary due to age or medical condition, Propofol infusion and Midazolam  Airway Management Planned: Natural Airway  Additional Equipment:   Intra-op Plan:   Post-operative Plan:   Informed Consent: I have reviewed the patients History and Physical, chart, labs and discussed the procedure including the risks, benefits and alternatives for the proposed anesthesia with the patient or authorized representative who has indicated his/her understanding and acceptance.     Dental advisory given  Plan Discussed with: CRNA  Anesthesia Plan Comments: (Discussed possible GA)      Anesthesia Quick Evaluation

## 2021-04-15 NOTE — Progress Notes (Signed)
Patient given discharge instructions. PIV removed. Telemetry box removed, CCMD notified. Personal belongings packed up and taken with patient to vehicle in wheelchair.  Kenard Gower, RN

## 2021-04-15 NOTE — Discharge Summary (Signed)
Name: Samuel Patrick MRN: [id number] DOB: 1966/08/26 55 y.o. PCP: Dr. Roe Rutherford, MD  Date of Admission: 04/13/2021  7:27 AM Date of Discharge: 04/15/2021 Attending Physician: Reymundo Poll, MD  Discharge Diagnosis: 1. COVID infection with GI predominant symptoms 2. Acute onset left-sided weakness of unclear etiology   Discharge Medications: Allergies as of 04/15/2021       Reactions   Hydromorphone Itching   Iodinated Diagnostic Agents Itching        Medication List     TAKE these medications    ciprofloxacin 500 MG tablet Commonly known as: CIPRO Take 500 mg by mouth 2 (two) times daily.   tamsulosin 0.4 MG Caps capsule Commonly known as: FLOMAX Take 0.4 mg by mouth daily.   zolpidem 10 MG tablet Commonly known as: AMBIEN Take 10 mg by mouth at bedtime.        Disposition and follow-up:   Samuel Patrick was discharged from [facility] in Stable condition. At the hospital follow up visit please address:  1.  COVID-19 infection with GI predominant symptoms: Follow-up improvement of symptoms with supportive care and pain medication.   2. Weakness of unclear etiology: Pt had acute onset left-sided weakness when he presented to the ED. Stroke workup including CT head and MRI brain was unremarkable. Unclear etiology for his weakness. With his renal dysfunction and chronic symptoms (muscle and joint pains per PCP notes), may consider autoimmune or connective tissue diseases.   3. AKI: Pt had bump of Cr from 1.37 to 1.75 in a 48 hour period. On further questioning, patient reports his Protonix was discontinued a few months ago by his urologist due to worsening kidney function. On chart review in care everywhere, he was seen by his PCP on 02/17/21 for an AKI and persistent renal dysfunction. He was instructed to discontinue diclofenac, which had been started three months prior, as well as his protonix which he had been taking for the  past year. He followed up with his urologist that same day and was treated for prostatitis with ciprofloxacin. He was seen by urology again on 03/17/2021. Symptoms of prostatitis had resolved unfortunately his renal function had not improved. Plan was for nephrology referral at his next visit if GFR was still reduced.  During his hospitalization here, we gave the patient a fluid challenge, ordered a UA and renal ultrasound (both normal), and held any offending medications. Given his normal UA, we have low suspicion for protonix-induced AKI but continued to hold his protonix at discharge.  4. Labs / imaging needed at time of follow-up: None  5.  Pending labs/ test needing follow-up: Patient left when renal ultrasound results returned, but these were found to be normal.   Follow-up Appointments:  Hospital Course by problem list: Symptomatic COVID-19 Infection, Hx of GERD and gastritis The pt presented to the ED on 07/05 with acute onset constant chest pressure and epigastric pain. ACS was ruled out initially with negative troponins x3, EKGs without ST changes. Found to be COVID + the night of his admission. Epigastric pain likely related to his COVID infection. This was managed with Tylenol, Maalox, Pepcid, and GI cocktail with lidocaine throughout his stay. Pt was briefly treated with simethicone and PPI, but these were discontinued prior to discharge in the setting of a possible protonix induced AIN.  Acute onset left-sided weakness, unclear etiology On 07/06, patient endorsed new onset left-sided weakness in his LUE and LLE that started the day prior (07/05) when he  was in the ED (he did not remember exactly what time). He also reported new onset dysphagia at that time. Physical exam was remarkable for 4/5 strength in the LUE and 3/5 strength of the LLE as well as decreased sensation in the LUE and LLE. We initiated stroke work-up. CT head wo contrast and MRI brain were both unremarkable. Unclear  etiology of the weakness, but will continue workup in the outpatient setting.  CKD (Stage 2), AKI Has CKD stage 2. Nephrotoxic medications were avoided when possible.  On 07/07, the patient developed an AKI. Held protonix and gave fluid challenge.  UA was unremarkable, and therefore had a low suspicion for PPI-induced AIN but held protonix at discharge. Renal ultrasound was normal.  Prediabetes A1c of 5.8. CBG's were monitored q morning throughout his stay.    Thyroid nodule On the day of his admission, the patient had CT imaging showing a possible left thyroid nodule that was >1 cm in diameter. Repeat TSH obtained was normal. Obtained a thyroid US on 07/06 which was unremarkable with no further work-up needed.   6. PAD, HLD Recent CT scan showed aortic atherosclerosis and a total cholesterol of 159 and an LDL of 110 back in 05/2020. ASCVD of 3.2 to 4.6%. Will continue to monitor in the OP setting.   7. BPH History of BPH and continued on Flomax throughout his hospital stay. Patient initially reported worsening of his lower urinary tract symptoms including increased urination, dribbling, and frequent nocturia. PVR was borderline at 157 mL. Renal US was normal.  8. Costochondritis His costochondritis was adequately controlled with a lidocaine patch throughout his hospital stay.    Discharge Exam:   BP 117/77 (BP Location: Right Arm)   Pulse 67   Temp 98.6 F (37 C) (Oral)   Resp 19   Ht  (1.702 m)   Wt 102.5 kg   SpO2 100%   BMI 35.38 kg/m  Discharge exam:  General: Well-developed, well-nourished, NAD HEENT: Normocephalic and atraumatic Cardiovascular: Normal rate and rhythm, no m/r/g. No LE swelling Respiratory: CTAB, breathing comfortably on room air Abdominal: Soft, non-distended. Bowel sounds heard throughout. +TTP in RUQ and epigastric areas. MSK: No LE edema. Skin: Warm and dry Psych: Normal mood and affect   Neurological Examination Motor: Right :  Upper  extremity   5/5              Left:     Upper extremity   4/5             Lower extremity   5/5                          Lower extremity   3/5 Tone and bulk:normal tone throughout; no atrophy noted Sensory: Decreased sensation to light touch in LUE and LLE. Sensation intact in RUE and RLE.  Cerebellar: normal finger-to-nose  Pertinent Labs, Studies, and Procedures:  DG Chest 2 View  Result Date: 04/13/2021 CLINICAL DATA:  Chest pain. EXAM: CHEST - 2 VIEW COMPARISON:  No prior FINDINGS: Mediastinum hilar structures normal. Heart size normal. Low lung volumes with mild bibasilar atelectasis. No focal infiltrate. Questionable tiny nodule left upper lobe. PA and lateral chest x-ray suggested for further evaluation. No pleural effusion or pneumothorax. No acute bony abnormality. IMPRESSION: 1. Low lung volumes with mild bibasilar atelectasis. No acute cardiopulmonary disease identified. 2. Questionable pulmonary nodule left upper lobe. PA and lateral chest x-ray suggested for further evaluation. Results  phoned by me to Dr. Madilyn Hook at 7:52 a.m. on 04/13/2021. Electronically Signed   By: Maisie Fus  Register   On: 04/13/2021 07:52   CT HEAD WO CONTRAST  Result Date: 04/14/2021 CLINICAL DATA:  Acute stroke presentation. Specific deficit not described. EXAM: CT HEAD WITHOUT CONTRAST TECHNIQUE: Contiguous axial images were obtained from the base of the skull through the vertex without intravenous contrast. COMPARISON:  None. FINDINGS: Brain: The brain shows a normal appearance without evidence of malformation, atrophy, old or acute small or large vessel infarction, mass lesion, hemorrhage, hydrocephalus or extra-axial collection. Vascular: No hyperdense vessel. No evidence of atherosclerotic calcification. Skull: Normal.  No traumatic finding.  No focal bone lesion. Sinuses/Orbits: Sinuses are clear. Orbits appear normal. Mastoids are clear. Other: None significant IMPRESSION: Normal head CT. Electronically Signed   By:  Paulina Fusi M.D.   On: 04/14/2021 10:23   US THYROID  Result Date: 04/15/2021 CLINICAL DATA:  Incidental on CT. EXAM: THYROID ULTRASOUND TECHNIQUE: Ultrasound examination of the thyroid gland and adjacent soft tissues was performed. COMPARISON:  None. FINDINGS: Parenchymal Echotexture: Normal Isthmus: 0.5 cm Right lobe: 4.3 x 2.2 x 2.2 cm Left lobe: 4.5 x 2.1 x 2.4 cm _________________________________________________________ Estimated total number of nodules >/= 1 cm: 2 Number of spongiform nodules >/=  2 cm not described below (TR1): 0 Number of mixed cystic and solid nodules >/= 1.5 cm not described below (TR2): 0 _________________________________________________________ Small isthmic and left-sided thyroid nodules are noted incidentally. The isthmic nodule is isoechoic and less than 1.5 cm in size and therefore does not meet criteria for further evaluation. The left mid thyroid nodule is spongiform in morphology which is considered benign and also does not require further evaluation. No nodules of consequence or suspicious features. IMPRESSION: Incidental note is made of 2 small benign/low risk thyroid nodules which do not meet criteria to warrant biopsy or dedicated imaging surveillance. No further follow-up necessary. The above is in keeping with the ACR TI-RADS recommendations - J Am Coll Radiol 2017;14:587-595. Electronically Signed   By: Malachy Moan M.D.   On: 04/15/2021 07:45   CT Angio Chest/Abd/Pel for Dissection W and/or Wo Contrast  Result Date: 04/13/2021 CLINICAL DATA:  Severe chest pain radiating to the back. Clinical concern for dissection. EXAM: CT ANGIOGRAPHY CHEST, ABDOMEN AND PELVIS TECHNIQUE: Non-contrast CT of the chest was initially obtained. Multidetector CT imaging through the chest, abdomen and pelvis was performed using the standard protocol during bolus administration of intravenous contrast. Multiplanar reconstructed images and MIPs were obtained and reviewed to evaluate  the vascular anatomy. CONTRAST:  OMNIPAQUE IOHEXOL 350 MG/ML SOLN COMPARISON:  None. FINDINGS: CTA CHEST FINDINGS Cardiovascular: The heart size is normal. No substantial pericardial effusion. Coronary artery calcification is evident. Atherosclerotic calcification is noted in the wall of the thoracic aorta. No thoracic aortic aneurysm. Pre contrast imaging shows no hyperdense crescent in the wall of the thoracic aorta to suggest the presence of an acute intramural hematoma. Postcontrast imaging shows no dissection flap in the thoracic aorta. There is no large central pulmonary embolus in the pulmonary outflow tract or main pulmonary arteries. No lobar pulmonary embolus. Mediastinum/Nodes: No mediastinal lymphadenopathy. 2.9 x 2.4 x 2.4 cm soft tissue nodule is identified in the region of the left thoracic inlet, immediately caudal to the left thyroid lobe. Image quality in this region is degraded by beam hardening artifact from contrast material in the left subclavian vein, but the nodule probably arises from the inferior left thyroid lobe. There  is no hilar lymphadenopathy. The esophagus has normal imaging features. There is no axillary lymphadenopathy. Lungs/Pleura: No suspicious pulmonary nodule or mass. Subsegmental atelectasis noted in the lower lobes bilaterally. No focal airspace consolidation. There is no evidence of pleural effusion. Musculoskeletal: No worrisome lytic or sclerotic osseous abnormality. Review of the MIP images confirms the above findings. CTA ABDOMEN AND PELVIS FINDINGS VASCULAR Aorta: Normal caliber aorta without aneurysm, dissection, vasculitis or significant stenosis. Celiac: Patent without evidence of aneurysm, dissection, vasculitis or significant stenosis. SMA: Patent without evidence of aneurysm, dissection, vasculitis or significant stenosis. Renals: Both renal arteries are patent without evidence of aneurysm, dissection, vasculitis, fibromuscular dysplasia or significant  stenosis. IMA: Patent without evidence of aneurysm, dissection, vasculitis or significant stenosis. Inflow: Patent without evidence of aneurysm, dissection, vasculitis or significant stenosis. Veins: No obvious venous abnormality within the limitations of this arterial phase study. Review of the MIP images confirms the above findings. NON-VASCULAR Hepatobiliary: No suspicious focal abnormality within the liver parenchyma. Calcified gallstones evident measuring up to 19 mm. No intrahepatic or extrahepatic biliary dilation. Pancreas: No focal mass lesion. No dilatation of the main duct. No intraparenchymal cyst. No peripancreatic edema. Spleen: No splenomegaly. No focal mass lesion. Adrenals/Urinary Tract: No adrenal nodule or mass. Kidneys unremarkable. No evidence for hydroureter. The urinary bladder appears normal for the degree of distention. Stomach/Bowel: Stomach is unremarkable. No gastric wall thickening. No evidence of outlet obstruction. Duodenum is normally positioned as is the ligament of Treitz. No small bowel wall thickening. No small bowel dilatation. The terminal ileum is normal. The appendix is not well visualized, but there is no edema or inflammation in the region of the cecum. No gross colonic mass. No colonic wall thickening. Diverticular changes noted left colon. There is some very subtle pericolonic edema/inflammation along the proximal sigmoid colon (images 235-239 of series 6). Lymphatic: There is no gastrohepatic or hepatoduodenal ligament lymphadenopathy. No retroperitoneal or mesenteric lymphadenopathy. No pelvic sidewall lymphadenopathy. Reproductive: Prostate gland is enlarged. Other: No intraperitoneal free fluid. Musculoskeletal: No worrisome lytic or sclerotic osseous abnormality. Review of the MIP images confirms the above findings. IMPRESSION: 1. No evidence for thoracoabdominal aortic aneurysm or dissection. No periaortic edema or hemorrhage to suggest aortic leak. 2. No large  central pulmonary embolus. 3. Left colonic diverticulosis with very subtle pericolonic edema/inflammation along the proximal sigmoid colon. Imaging features suggest diverticulitis without perforation or abscess. 4. 2.9 x 2.4 x 2.4 cm soft tissue nodule in the region of the left thoracic inlet, immediately caudal to the left thyroid lobe. Image quality in this region is degraded by beam hardening artifact, but the nodule probably arises from the inferior left thyroid lobe. Recommend thyroid US. (Ref: J Am Coll Radiol. 2015 Feb;12(2): 143-50). 5. Cholelithiasis. 6. Aortic Atherosclerosis (ICD10-I70.0). Electronically Signed   By: Kennith Center M.D.   On: 04/13/2021 09:44     Renal Ultrasound, 04/15/2021: CLINICAL DATA:  Urinary retention due to BPH. EXAM: RENAL / URINARY TRACT ULTRASOUND COMPLETE COMPARISON:  CT 04/13/2021 FINDINGS: Right Kidney: Renal measurements: 10.2 x 4.8 x 4.8 cm = volume: 123 mL. Echogenicity within normal limits. No hydronephrosis. No visualized stone or focal lesion. Left Kidney: Renal measurements: 11.4 x 4.7 x 4.4 cm = volume: 122 mL. Echogenicity within normal limits. No hydronephrosis. No visualized stone or focal lesion. Bladder: Appears normal for degree of bladder distention. Both ureteral jets are seen. Other: Enlarged prostate measuring 5.7 x 4.4 x 4.3 cm cause of nodular projection into the bladder base.  IMPRESSION: 1.  Unremarkable sonographic appearance of the kidneys and bladder. 2. Enlarged prostate gland causing projection into the bladder base.Electronically Signed   By: Narda RutherfordMelanie  Sanford M.D.   On: 04/15/2021 16:40     CBC Latest Ref Rng & Units 04/15/2021 04/14/2021 04/13/2021  WBC 4.0 - 10.5 K/uL 5.2 4.6 5.7  Hemoglobin 13.0 - 17.0 g/dL 12.9(L) 13.0 14.0  Hematocrit 39.0 - 52.0 % 41.0 41.1 45.6  Platelets 150 - 400 K/uL 212 218 249   CMP Latest Ref Rng & Units 04/15/2021 04/14/2021 04/13/2021  Glucose 70 - 99 mg/dL 409(W122(H) 119(J111(H) 478(G104(H)  BUN 6 - 20  mg/dL 15 9 15   Creatinine 0.61 - 1.24 mg/dL 9.56(O1.75(H) 1.30(Q1.49(H) 6.57(Q1.37(H)  Sodium 135 - 145 mmol/L 137 138 138  Potassium 3.5 - 5.1 mmol/L 3.5 4.0 3.7  Chloride 98 - 111 mmol/L 106 108 109  CO2 22 - 32 mmol/L 27 26 21(L)  Calcium 8.9 - 10.3 mg/dL 8.9 9.0 9.3  Total Protein 6.5 - 8.1 g/dL 6.1(L) 6.3(L) 7.3  Total Bilirubin 0.3 - 1.2 mg/dL 0.7 0.8 0.8  Alkaline Phos 38 - 126 U/L 61 57 66  AST 15 - 41 U/L 18 19 20   ALT 0 - 44 U/L 20 20 22     Urine dipstick shows negative for all components.  Micro exam: negative for WBC's or RBC's.     Component Ref Range & Units 1 d ago  (04/14/21) 2 d ago  (04/13/21) 2 d ago  (04/13/21)  Troponin I (High Sensitivity) <18 ng/L 4  3 CM  4 CM      Signed: Fredonia HighlandMarianne Bonanno, MD 04/15/2021, 5:31 PM   Pager: [phone]

## 2021-04-15 NOTE — Progress Notes (Signed)
Bladder scan performed showing . Pt tolerated well. Results documented in flowsheets.  Kenard Gower, RN

## 2021-04-15 NOTE — Transfer of Care (Signed)
Immediate Anesthesia Transfer of Care Note  Patient: Samuel Patrick  Procedure(s) Performed: MRI WITH ANESTHESIA OF BRAIN W/O CONSTRAST  Patient Location: PACU  Anesthesia Type:MAC  Level of Consciousness: awake, alert  and oriented  Airway & Oxygen Therapy: Patient Spontanous Breathing  Post-op Assessment: Report given to RN and Post -op Vital signs reviewed and stable  Post vital signs: Reviewed and stable  Last Vitals:  Vitals Value Taken Time  BP    Temp    Pulse    Resp    SpO2      Last Pain:  Vitals:   04/15/21 0811  TempSrc: Oral  PainSc:          Complications: No notable events documented.

## 2021-04-15 NOTE — Progress Notes (Signed)
Pt received from MRI. VSS. Call light in reach.  Versie Starks, RN'

## 2021-04-15 NOTE — Anesthesia Procedure Notes (Signed)
Procedure Name: MAC Date/Time: 04/15/2021 12:35 PM Performed by: Trinna Post., CRNA Pre-anesthesia Checklist: Patient identified, Patient being monitored, Emergency Drugs available, Timeout performed and Suction available Oxygen Delivery Method: Nasal cannula Placement Confirmation: positive ETCO2

## 2021-04-15 NOTE — Progress Notes (Signed)
ANTICOAGULATION CONSULT NOTE - Initial Consult  Pharmacy Consult for Lovenox Indication: VTE prophylaxis  Allergies  Allergen Reactions   Hydromorphone Itching   Iodinated Diagnostic Agents Itching    Patient Measurements: Height: 5\' 7"  (170.2 cm) Weight: 102.5 kg (225 lb 14.4 oz) IBW/kg (Calculated) : 66.1  Vital Signs: Temp: 98.3 F (36.8 C) (07/07 0353) Temp Source: Oral (07/07 0353) BP: 125/81 (07/07 0353) Pulse Rate: 76 (07/07 0353)  Labs: Recent Labs    04/13/21 0730 04/13/21 0933 04/14/21 0112 04/15/21 0211  HGB 14.0  --  13.0 12.9*  HCT 45.6  --  41.1 41.0  PLT 249  --  218 212  CREATININE 1.37*  --  1.49* 1.75*  TROPONINIHS 4 3 4   --     Estimated Creatinine Clearance: 55.1 mL/min (A) (by C-G formula based on SCr of 1.75 mg/dL (H)).   Medical History: History reviewed. No pertinent past medical history.    Assessment: 55 y.o. M presents with COVID-19. Pharmacy consulted to dose Lovenox for VTE prophylaxis. BMI 35. CBC stable on admission.  Goal of Therapy:  Prevention of VTE Monitor platelets by anticoagulation protocol: Yes   Plan:  Lovenox 50mg  (0.5mg /kg) q24h Pharmacy will sign off - please reconsult if needed  , PharmD, BCPS Please see amion for complete clinical pharmacist phone list 04/15/2021,5:00 AM

## 2021-04-15 NOTE — Progress Notes (Signed)
SLP Cancellation Note  Patient Details Name: Samuel Patrick MRN: [id number] DOB: [birth year]   Cancelled treatment:       Reason Eval/Treat Not Completed: Patient at procedure or test/unavailable; pt NPO for MRI.  ST will f/u when schedule allows.     Tressie Stalker, M.S., CCC-SLP 04/15/2021, 12:22 PM

## 2021-04-15 NOTE — Progress Notes (Signed)
PVR 157 cc.

## 2021-04-15 NOTE — Anesthesia Postprocedure Evaluation (Signed)
Anesthesia Post Note  Patient: Samuel Patrick  Procedure(s) Performed: MRI WITH ANESTHESIA OF BRAIN W/O CONSTRAST     Patient location during evaluation: PACU Anesthesia Type: MAC Level of consciousness: awake and alert Pain management: pain level controlled Vital Signs Assessment: post-procedure vital signs reviewed and stable Respiratory status: spontaneous breathing Cardiovascular status: stable Anesthetic complications: no   No notable events documented.  Last Vitals:  Vitals:   04/15/21 1322 04/15/21 1338  BP: 118/85 117/77  Pulse:  67  Resp: 18 19  Temp:  37 C  SpO2: 98% 100%    Last Pain:  Vitals:   04/15/21 1338  TempSrc: Oral  PainSc:                  Nolon Nations

## 2021-04-16 ENCOUNTER — Encounter (HOSPITAL_COMMUNITY): Payer: Self-pay | Admitting: Radiology

## 2021-05-12 DIAGNOSIS — F419 Anxiety disorder, unspecified: Secondary | ICD-10-CM

## 2021-05-12 DIAGNOSIS — F32A Depression, unspecified: Secondary | ICD-10-CM

## 2021-05-12 HISTORY — DX: Anxiety disorder, unspecified: F41.9

## 2021-05-12 HISTORY — DX: Depression, unspecified: F32.A

## 2022-07-10 IMAGING — MR MR HEAD W/O CM
6 of 11 series · 26 of 48 positions shown · non-contrast
Comparison: CT head April 14, 2021.  The

CLINICAL DATA: Neuro deficit, acute stroke suspected.

EXAM:
MRI HEAD WITHOUT CONTRAST
TECHNIQUE: Multiplanar, multiecho pulse sequences of the brain and surrounding
structures were obtained without intravenous contrast.

[Series 2: DWI · axial · 3.0mm · 0.94mm/px · z∈[-65,+82]mm · 8 of 100 slices shown (1 of 2)]
[im 1/100]
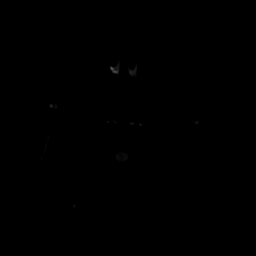
[im 12/100]
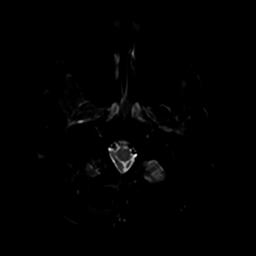
[im 34/100]
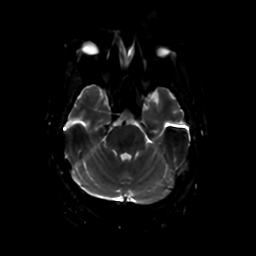
[im 45/100]
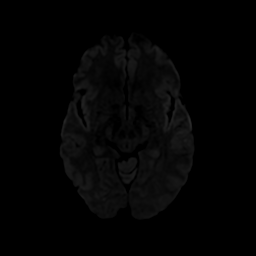
[im 56/100]
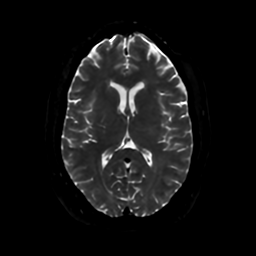
[im 67/100]
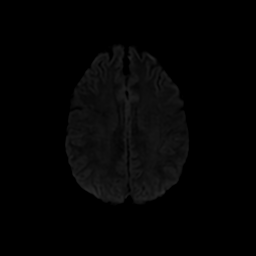
[im 89/100]
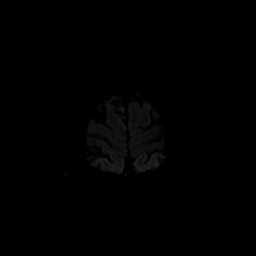
[im 100/100]
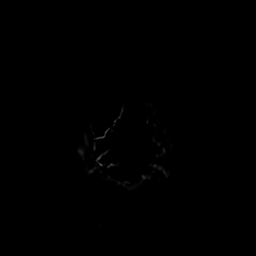

[Series 3: DWI · coronal · 4.0mm · 0.94mm/px · 6 of 72 slices shown (2 of 2)]
[im 1/72]
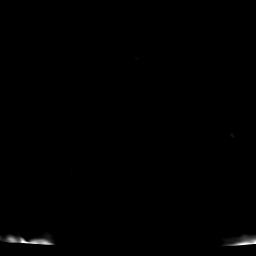
[im 15/72]
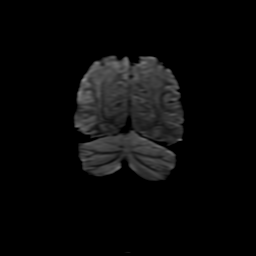
[im 29/72]
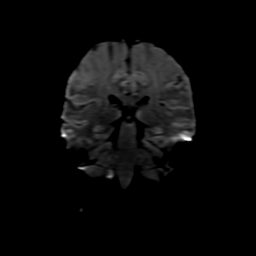
[im 43/72]
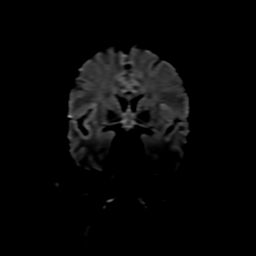
[im 57/72]
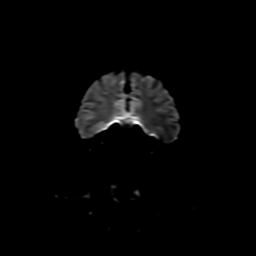
[im 72/72]
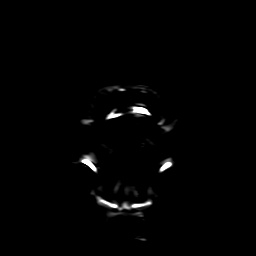

[Series 4: FLAIR · sagittal · 5.0mm · 0.23mm/px · 2 of 23 slices shown (1 of 2)]
[im 1/23]
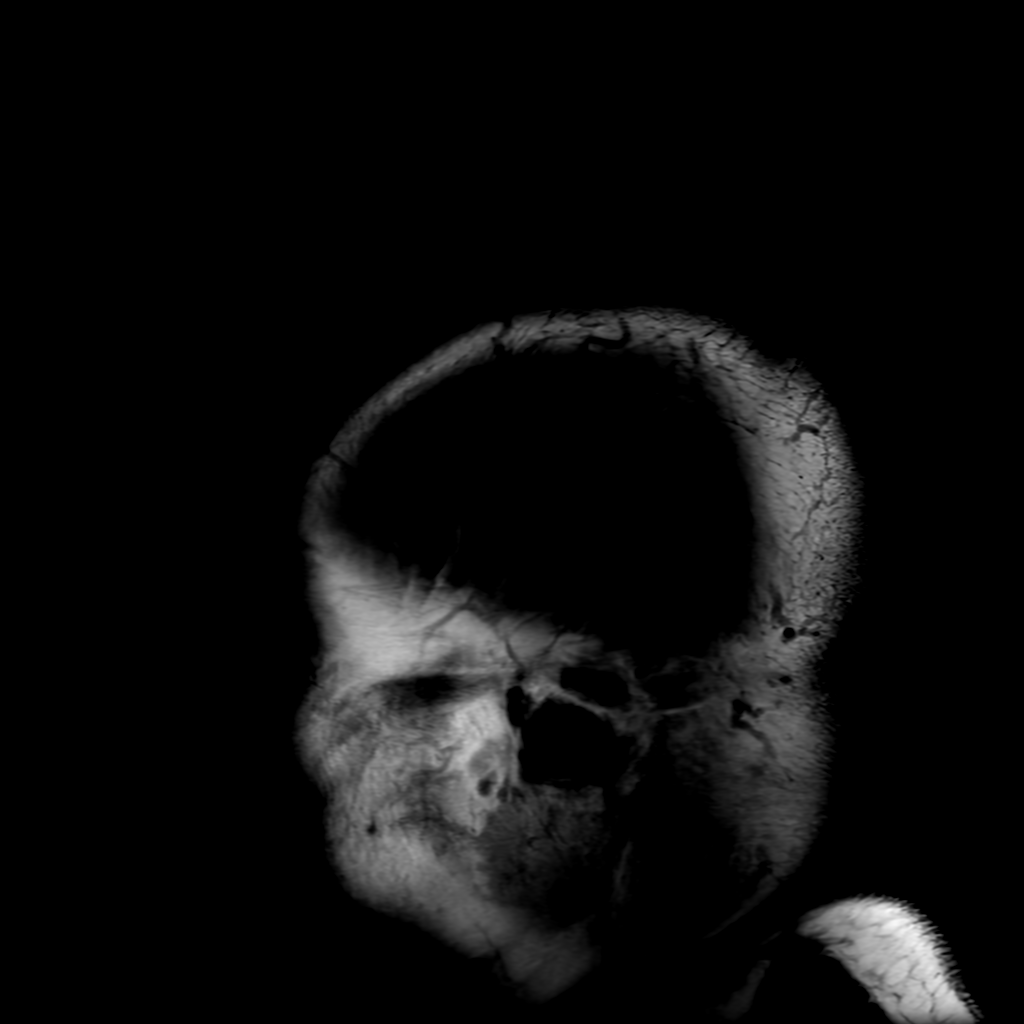
[im 23/23]
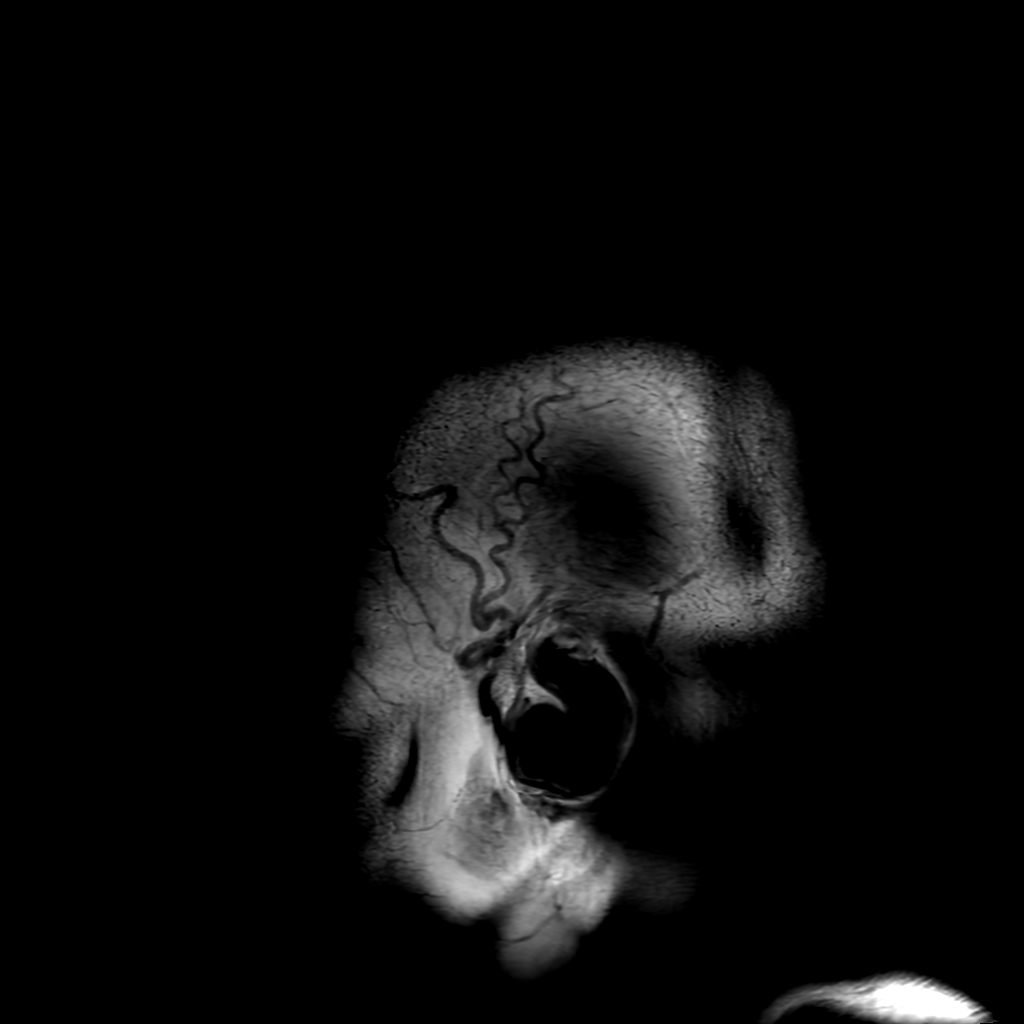

[Series 6: FLAIR · axial · 4.0mm · 0.45mm/px · z∈[-70,+79]mm · 3 of 35 slices shown (2 of 2)]
[im 1/35]
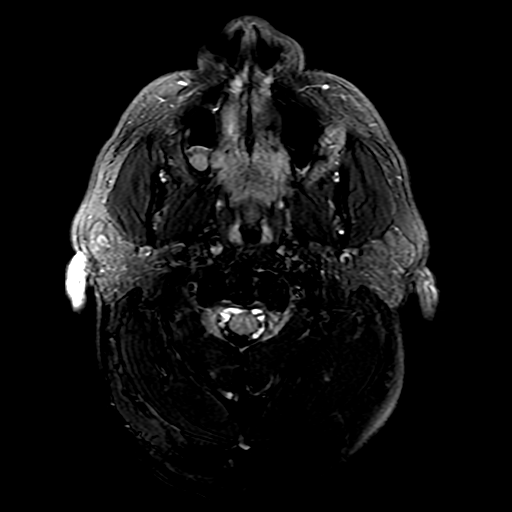
[im 18/35]
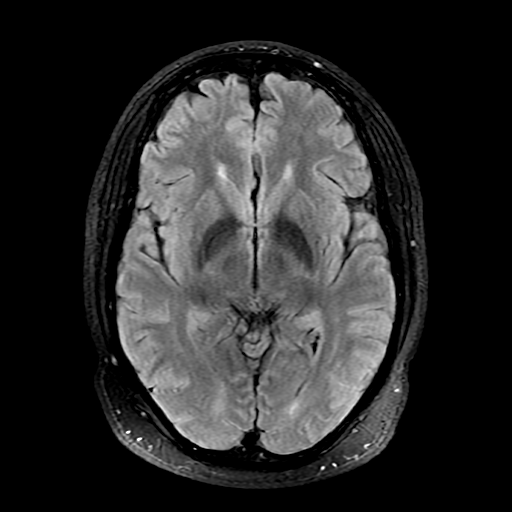
[im 35/35]
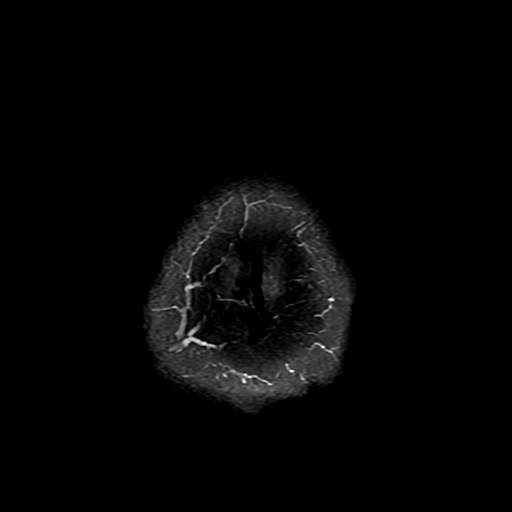

[Series 250: ADC · axial · 3.0mm · 0.94mm/px · z∈[-65,+82]mm · 4 of 50 slices shown (1 of 2)]
[im 1/50]
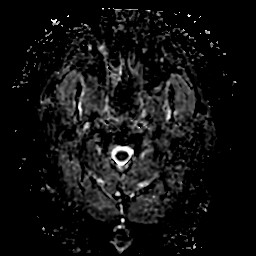
[im 17/50]
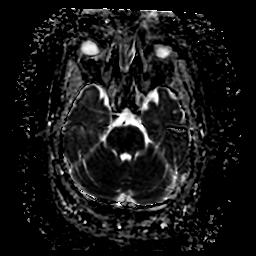
[im 33/50]
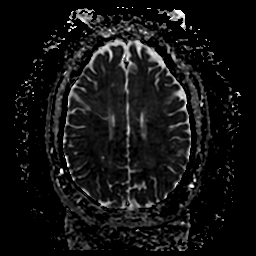
[im 50/50]
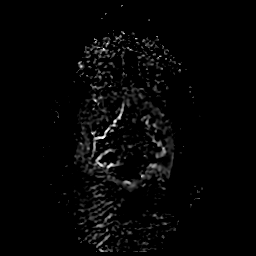

[Series 350: ADC · coronal · 4.0mm · 0.94mm/px · 3 of 36 slices shown (2 of 2)]
[im 1/36]
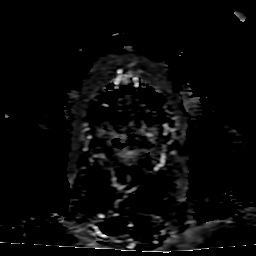
[im 18/36]
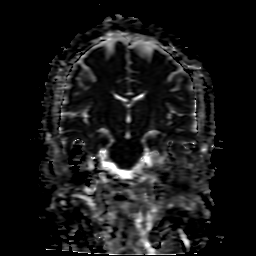
[im 36/36]
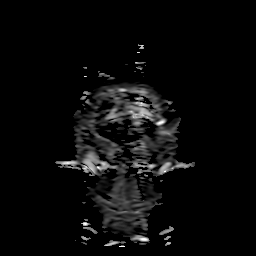

[26 of 48 positions shown; findings below may reference images not displayed]

FINDINGS: Brain: No acute infarction, hemorrhage, hydrocephalus, extra-axial
collection or mass lesion. Dilated perivascular spaces in bilateral
inferior basal ganglia.

Vascular: Major arterial flow voids are maintained at the skull
base.

Skull and upper cervical spine: Normal marrow signal.

Sinuses/Orbits: Partially imaged right maxillary sinus retention
cyst versus polyp.

Other: No mastoid effusions.
IMPRESSION: No evidence of acute intracranial abnormality. Specifically, no
acute infarct. The

## 2022-10-10 DIAGNOSIS — H903 Sensorineural hearing loss, bilateral: Secondary | ICD-10-CM

## 2022-10-10 HISTORY — DX: Sensorineural hearing loss, bilateral: H90.3

## 2023-01-23 ENCOUNTER — Other Ambulatory Visit: Payer: Self-pay

## 2023-01-23 ENCOUNTER — Emergency Department (HOSPITAL_BASED_OUTPATIENT_CLINIC_OR_DEPARTMENT_OTHER)
Admission: EM | Admit: 2023-01-23 | Discharge: 2023-01-23 | Disposition: A | Discharge status: Home / Self Care | Payer: PRIVATE HEALTH INSURANCE | Attending: Emergency Medicine | Admitting: Emergency Medicine

## 2023-01-23 ENCOUNTER — Emergency Department (HOSPITAL_BASED_OUTPATIENT_CLINIC_OR_DEPARTMENT_OTHER): Payer: PRIVATE HEALTH INSURANCE

## 2023-01-23 DIAGNOSIS — K5732 Diverticulitis of large intestine without perforation or abscess without bleeding: Secondary | ICD-10-CM | POA: Diagnosis not present

## 2023-01-23 DIAGNOSIS — R1032 Left lower quadrant pain: Secondary | ICD-10-CM | POA: Diagnosis present

## 2023-01-23 DIAGNOSIS — K5792 Diverticulitis of intestine, part unspecified, without perforation or abscess without bleeding: Secondary | ICD-10-CM

## 2023-01-23 LAB — CBC
HCT: 43 % (ref 39.0–52.0)
Hemoglobin: 13.8 g/dL (ref 13.0–17.0)
MCH: 24.4 pg — ABNORMAL LOW (ref 26.0–34.0)
MCHC: 32.1 g/dL (ref 30.0–36.0)
MCV: 76.1 fL — ABNORMAL LOW (ref 80.0–100.0)
Platelets: 276 10*3/uL (ref 150–400)
RBC: 5.65 MIL/uL (ref 4.22–5.81)
RDW: 15.9 % — ABNORMAL HIGH (ref 11.5–15.5)
WBC: 7.6 10*3/uL (ref 4.0–10.5)
nRBC: 0 % (ref 0.0–0.2)

## 2023-01-23 LAB — DIFFERENTIAL
Abs Immature Granulocytes: 0.02 10*3/uL (ref 0.00–0.07)
Basophils Absolute: 0 10*3/uL (ref 0.0–0.1)
Basophils Relative: 0 %
Eosinophils Absolute: 0.1 10*3/uL (ref 0.0–0.5)
Eosinophils Relative: 2 %
Immature Granulocytes: 0 %
Lymphocytes Relative: 31 %
Lymphs Abs: 2.4 10*3/uL (ref 0.7–4.0)
Monocytes Absolute: 0.4 10*3/uL (ref 0.1–1.0)
Monocytes Relative: 6 %
Neutro Abs: 4.7 10*3/uL (ref 1.7–7.7)
Neutrophils Relative %: 61 %

## 2023-01-23 LAB — COMPREHENSIVE METABOLIC PANEL
ALT: 36 U/L (ref 0–44)
AST: 15 U/L (ref 15–41)
Albumin: 4.3 g/dL (ref 3.5–5.0)
Alkaline Phosphatase: 74 U/L (ref 38–126)
Anion gap: 10 (ref 5–15)
BUN: 16 mg/dL (ref 6–20)
CO2: 22 mmol/L (ref 22–32)
Calcium: 9.5 mg/dL (ref 8.9–10.3)
Chloride: 104 mmol/L (ref 98–111)
Creatinine, Ser: 1.33 mg/dL — ABNORMAL HIGH (ref 0.61–1.24)
GFR, Estimated: >60 mL/min (ref >60)
Glucose, Bld: 96 mg/dL (ref 70–99)
Potassium: 4 mmol/L (ref 3.5–5.1)
Sodium: 136 mmol/L (ref 135–145)
Total Bilirubin: 0.9 mg/dL (ref 0.3–1.2)
Total Protein: 7.3 g/dL (ref 6.5–8.1)

## 2023-01-23 LAB — LIPASE, BLOOD: Lipase: 39 U/L (ref 11–51)

## 2023-01-23 MED ORDER — ONDANSETRON HCL 4 MG/2ML IJ SOLN
4.0000 mg | Freq: Once | INTRAMUSCULAR | Status: AC
  Administered 2023-01-23: 4 mg via INTRAVENOUS
  Filled 2023-01-23: qty 2

## 2023-01-23 MED ORDER — ONDANSETRON 8 MG PO TBDP: 8.0000 mg | ORAL_TABLET | Freq: Three times a day (TID) | ORAL | 0 refills | Status: DC | PRN

## 2023-01-23 MED ORDER — FENTANYL CITRATE PF 50 MCG/ML IJ SOSY
50.0000 ug | PREFILLED_SYRINGE | Freq: Once | INTRAMUSCULAR | Status: AC
  Administered 2023-01-23: 50 ug via INTRAVENOUS
  Filled 2023-01-23: qty 1

## 2023-01-23 MED ORDER — MORPHINE SULFATE (PF) 4 MG/ML IV SOLN
4.0000 mg | Freq: Once | INTRAVENOUS | Status: AC
  Administered 2023-01-23: 4 mg via INTRAVENOUS
  Filled 2023-01-23: qty 1

## 2023-01-23 MED ORDER — SODIUM CHLORIDE 0.9 % IV BOLUS
1000.0000 mL | Freq: Once | INTRAVENOUS | Status: AC
  Administered 2023-01-23: 1000 mL via INTRAVENOUS

## 2023-01-23 MED ORDER — KETOROLAC TROMETHAMINE 15 MG/ML IJ SOLN
15.0000 mg | Freq: Once | INTRAMUSCULAR | Status: AC
  Administered 2023-01-23: 15 mg via INTRAVENOUS
  Filled 2023-01-23: qty 1

## 2023-01-23 MED ORDER — AMOXICILLIN-POT CLAVULANATE 875-125 MG PO TABS: 1.0000 | ORAL_TABLET | Freq: Two times a day (BID) | ORAL | 0 refills | Status: DC

## 2023-01-23 MED ORDER — OXYCODONE-ACETAMINOPHEN 5-325 MG PO TABS: 1.0000 | ORAL_TABLET | Freq: Four times a day (QID) | ORAL | 0 refills | Status: DC | PRN

## 2023-01-23 NOTE — ED Notes (Signed)
Pt verbalized understanding of d/c instructions, meds, and followup care. Denies questions. VSS, no distress noted. Steady gait to exit with all belongings.  ?

## 2023-01-23 NOTE — Discharge Instructions (Signed)
You have diverticulitis, this is inflammation of your lower colon.  Take the Augmentin twice daily for 7 days.  You take the Percocet as needed for severe pain, take it with Zofran to prevent nausea and vomiting.  Clear liquid diet for the next 24 hours, that slowly reintroduce food.  He will need to follow-up with a gastroenterologist in 6 months to ensure resolution of the colonoscopy.  Return to the ED if things change or worsen.

## 2023-01-23 NOTE — ED Triage Notes (Signed)
Patient arrives with complaints of worsening abdominal pain/flank pain on the left side x2 days. Sent here by UC for further evaluation due to a history of kidney stones.

## 2023-01-23 NOTE — ED Provider Notes (Signed)
[place] EMERGENCY DEPARTMENT AT [facility] Provider Note   CSN: [id number] Arrival date & time: 01/23/23  1343     History  Chief Complaint  Patient presents with   Abdominal Pain   Flank Pain    [place] Boyett is a 57 y.o. male.   Abdominal Pain Flank Pain Associated symptoms include abdominal pain.     Patient is a 57 year old male presenting to the emergency department due to left lower quadrant and flank pain  Symptoms started yesterday, they have been constant and associated with nausea but no vomiting.  The pain is radiating up his left flank, feels similar to previous kidney stones.  Denies any hematuria or dysuria.  No history of previous abdominal surgeries.    Prior to Admission medications   Medication Sig Start Date End Date Taking? Authorizing Provider  amoxicillin-clavulanate (AUGMENTIN) 875-125 MG tablet Take 1 tablet by mouth every 12 (twelve) hours. 01/23/23  Yes Theron Arista, PA-C  ondansetron (ZOFRAN-ODT) 8 MG disintegrating tablet Take 1 tablet (8 mg total) by mouth every 8 (eight) hours as needed for nausea or vomiting. 01/23/23  Yes Theron Arista, PA-C  oxyCODONE-acetaminophen (PERCOCET/ROXICET) 5-325 MG tablet Take 1 tablet by mouth every 6 (six) hours as needed for severe pain. 01/23/23  Yes Theron Arista, PA-C  ciprofloxacin (CIPRO) 500 MG tablet Take 500 mg by mouth 2 (two) times daily.    [provider]  tamsulosin (FLOMAX) 0.4 MG CAPS capsule Take 0.4 mg by mouth daily. 03/11/21   [provider]  zolpidem (AMBIEN) 10 MG tablet Take 10 mg by mouth at bedtime. 04/17/18   [provider]      Allergies    Hydromorphone and Iodinated contrast media    Review of Systems   Review of Systems  Gastrointestinal:  Positive for abdominal pain.  Genitourinary:  Positive for flank pain.    Physical Exam Updated Vital Signs BP (!) 142/78 (BP Location: Right Arm)   Pulse 85   Temp 98.2 F (36.8 C) (Oral)   Resp 20   Ht   (1.702 m)   Wt 108.9 kg   SpO2 99%   BMI 37.59 kg/m  Physical Exam Vitals and nursing note reviewed. Exam conducted with a chaperone present.  Constitutional:      Appearance: Normal appearance.  HENT:     Head: Normocephalic and atraumatic.  Eyes:     General: No scleral icterus.       Right eye: No discharge.        Left eye: No discharge.     Extraocular Movements: Extraocular movements intact.     Pupils: Pupils are equal, round, and reactive to light.  Cardiovascular:     Rate and Rhythm: Normal rate and regular rhythm.     Pulses: Normal pulses.     Heart sounds: Normal heart sounds. No murmur heard.    No friction rub. No gallop.  Pulmonary:     Effort: Pulmonary effort is normal. No respiratory distress.     Breath sounds: Normal breath sounds.  Abdominal:     General: Abdomen is flat. Bowel sounds are normal. There is no distension.     Palpations: Abdomen is soft.     Tenderness: There is abdominal tenderness in the left lower quadrant. There is no right CVA tenderness or left CVA tenderness.  Skin:    General: Skin is warm and dry.     Coloration: Skin is not jaundiced.  Neurological:  Mental Status: He is alert. Mental status is at baseline.     Coordination: Coordination normal.     ED Results / Procedures / Treatments   Labs (all labs ordered are listed, but only abnormal results are displayed) Labs Reviewed  COMPREHENSIVE METABOLIC PANEL - Abnormal; Notable for the following components:      Result Value   Creatinine, Ser 1.33 (*)    All other components within normal limits  CBC - Abnormal; Notable for the following components:   MCV 76.1 (*)    MCH 24.4 (*)    RDW 15.9 (*)    All other components within normal limits  LIPASE, BLOOD  DIFFERENTIAL  URINALYSIS, ROUTINE W REFLEX MICROSCOPIC    EKG None  Radiology CT Renal Stone Study  Result Date: 01/23/2023 CLINICAL DATA:  Left-sided flank pain for 2 days. History of kidney stones  EXAM: CT ABDOMEN AND PELVIS WITHOUT CONTRAST TECHNIQUE: Multidetector CT imaging of the abdomen and pelvis was performed following the standard protocol without IV contrast. RADIATION DOSE REDUCTION: This exam was performed according to the departmental dose-optimization program which includes automated exposure control, adjustment of the mA and/or kV according to patient size and/or use of iterative reconstruction technique. COMPARISON:  CT 04/13/2021.  Ultrasound 04/15/2021 FINDINGS: Lower chest: Bandlike linear changes at the lung bases likely scar or atelectasis. Pleural effusion at the lung bases. Trace pericardial fluid. Hepatobiliary: Stones in the nondilated gallbladder. Small calcification along the margin of the right hepatic lobe on series 2, image 22, nonspecific. Please correlate for any old infectious or inflammatory process or traumatic process. Unchanged from previous. Pancreas: Unremarkable. No pancreatic ductal dilatation or surrounding inflammatory changes. Spleen: Normal in size without focal abnormality. Adrenals/Urinary Tract: The adrenal glands are preserved. Punctate nonobstructing upper pole right-sided renal stone best seen on coronal imaging. There are several punctate stones in the left kidney as well measuring up to 3 mm. No left ureteral stone. Stomach/Bowel: Large bowel has a normal course and caliber. Mild stool. Left-sided colonic diverticula with stranding along the proximal sigmoid colon with some subtle wall thickening. Subtle diverticulitis. No complicating features of free air, fluid or obstruction. The stomach and small bowel are nondilated. Vascular/Lymphatic: Normal caliber aorta and IVC with mild vascular calcifications. Reproductive: Prostate is enlarged. Significant mass effect along the base of the bladder. Please correlate with patient's PSA Other: No free air or free fluid. Musculoskeletal: Scattered degenerative changes are seen along the spine. Degenerative changes of  the pelvis as well. IMPRESSION: Bilateral nonobstructing renal stones, left-greater-than-right. No ureteral stones. Slight wall thickening along the proximal sigmoid colon with stranding and diverticula consistent with subtle diverticulitis. No complicating features at this time. Recommend follow-up to confirm clearance and exclude secondary pathology. Gallstones Electronically Signed   By: Karen Kays M.D.   On: 01/23/2023 15:19    Procedures Procedures    Medications Ordered in ED Medications  ondansetron (ZOFRAN) injection 4 mg (4 mg Intravenous Given 01/23/23 1445)  sodium chloride 0.9 % bolus 1,000 mL (0 mLs Intravenous Stopped 01/23/23 1622)  fentaNYL (SUBLIMAZE) injection 50 mcg (50 mcg Intravenous Given 01/23/23 1447)  ketorolac (TORADOL) 15 MG/ML injection 15 mg (15 mg Intravenous Given 01/23/23 1446)  morphine (PF) 4 MG/ML injection 4 mg (4 mg Intravenous Given 01/23/23 1611)    ED Course/ Medical Decision Making/ A&P  Medical Decision Making Amount and/or Complexity of Data Reviewed Labs: ordered. Radiology: ordered.  Risk Prescription drug management.   Patient is a 57 year old male presenting to the emergency department due to left lower quadrant abdominal pain.  Also having flank pain.  Differential includes diverticulitis, nephrolithiasis, pyelonephritis, UTI, colitis.  CBC without leukocytosis or anemia.  Lipase within normal limits, not consistent with pancreatitis.  CMP is without gross electrolyte derangement, creatinine is elevated 1.33 but actually improved compared to baseline.  CT renal study was negative for stone.  Notable for diverticulitis.  Patient's pain is improved with the morphine and fentanyl.  Patient is unable to provide urine sample, states he has to go with his mother was just checked in to [facility] ER.   We discussed the workup, management with outpatient antibiotics, clear liquid diet and GI follow-up.  Patient stable for  discharge at this time.        Final Clinical Impression(s) / ED Diagnoses Final diagnoses:  Diverticulitis    Rx / DC Orders ED Discharge Orders          Ordered    amoxicillin-clavulanate (AUGMENTIN) 875-125 MG tablet  Every 12 hours        01/23/23 1558    ondansetron (ZOFRAN-ODT) 8 MG disintegrating tablet  Every 8 hours PRN        01/23/23 1558    oxyCODONE-acetaminophen (PERCOCET/ROXICET) 5-325 MG tablet  Every 6 hours PRN        01/23/23 1558              Theron Arista, PA-C 01/23/23 1805    Alvira Monday, MD 01/23/23 2227

## 2023-05-20 ENCOUNTER — Emergency Department (HOSPITAL_BASED_OUTPATIENT_CLINIC_OR_DEPARTMENT_OTHER): Payer: No Typology Code available for payment source

## 2023-05-20 ENCOUNTER — Other Ambulatory Visit: Payer: Self-pay

## 2023-05-20 ENCOUNTER — Emergency Department (HOSPITAL_BASED_OUTPATIENT_CLINIC_OR_DEPARTMENT_OTHER)
Admission: EM | Admit: 2023-05-20 | Discharge: 2023-05-21 | Disposition: A | Discharge status: Home / Self Care | Payer: No Typology Code available for payment source | Attending: Emergency Medicine | Admitting: Emergency Medicine

## 2023-05-20 ENCOUNTER — Encounter (HOSPITAL_BASED_OUTPATIENT_CLINIC_OR_DEPARTMENT_OTHER): Payer: Self-pay | Admitting: Emergency Medicine

## 2023-05-20 DIAGNOSIS — N182 Chronic kidney disease, stage 2 (mild): Secondary | ICD-10-CM | POA: Insufficient documentation

## 2023-05-20 DIAGNOSIS — R109 Unspecified abdominal pain: Secondary | ICD-10-CM

## 2023-05-20 DIAGNOSIS — N200 Calculus of kidney: Secondary | ICD-10-CM

## 2023-05-20 DIAGNOSIS — R1032 Left lower quadrant pain: Secondary | ICD-10-CM

## 2023-05-20 LAB — CBC WITH DIFFERENTIAL/PLATELET
Abs Immature Granulocytes: 0.02 10*3/uL (ref 0.00–0.07)
Basophils Absolute: 0 10*3/uL (ref 0.0–0.1)
Basophils Relative: 0 %
Eosinophils Absolute: 0.1 10*3/uL (ref 0.0–0.5)
Eosinophils Relative: 1 %
HCT: 42.1 % (ref 39.0–52.0)
Hemoglobin: 13.4 g/dL (ref 13.0–17.0)
Immature Granulocytes: 0 %
Lymphocytes Relative: 47 %
Lymphs Abs: 2.8 10*3/uL (ref 0.7–4.0)
MCH: 24.5 pg — ABNORMAL LOW (ref 26.0–34.0)
MCHC: 31.8 g/dL (ref 30.0–36.0)
MCV: 77 fL — ABNORMAL LOW (ref 80.0–100.0)
Monocytes Absolute: 0.3 10*3/uL (ref 0.1–1.0)
Monocytes Relative: 5 %
Neutro Abs: 2.7 10*3/uL (ref 1.7–7.7)
Neutrophils Relative %: 47 %
Platelets: 266 10*3/uL (ref 150–400)
RBC: 5.47 MIL/uL (ref 4.22–5.81)
RDW: 15.8 % — ABNORMAL HIGH (ref 11.5–15.5)
WBC: 5.9 10*3/uL (ref 4.0–10.5)
nRBC: 0 % (ref 0.0–0.2)

## 2023-05-20 MED ORDER — KETOROLAC TROMETHAMINE 30 MG/ML IJ SOLN
15.0000 mg | Freq: Once | INTRAMUSCULAR | Status: AC
  Administered 2023-05-20: 15 mg via INTRAVENOUS
  Filled 2023-05-20: qty 1

## 2023-05-20 MED ORDER — FENTANYL CITRATE PF 50 MCG/ML IJ SOSY
50.0000 ug | PREFILLED_SYRINGE | Freq: Once | INTRAMUSCULAR | Status: AC
  Administered 2023-05-20: 50 ug via INTRAVENOUS
  Filled 2023-05-20: qty 1

## 2023-05-20 MED ORDER — MORPHINE SULFATE (PF) 4 MG/ML IV SOLN
5.0000 mg | Freq: Once | INTRAVENOUS | Status: AC
  Administered 2023-05-20: 5 mg via INTRAVENOUS
  Filled 2023-05-20: qty 2

## 2023-05-20 MED ORDER — ONDANSETRON HCL 4 MG/2ML IJ SOLN
4.0000 mg | Freq: Once | INTRAMUSCULAR | Status: AC
  Administered 2023-05-20: 4 mg via INTRAVENOUS
  Filled 2023-05-20: qty 2

## 2023-05-20 MED ORDER — SODIUM CHLORIDE 0.9 % IV BOLUS
1000.0000 mL | Freq: Once | INTRAVENOUS | Status: AC
  Administered 2023-05-20: 1000 mL via INTRAVENOUS

## 2023-05-20 NOTE — ED Provider Notes (Signed)
[place] EMERGENCY DEPARTMENT AT [facility] Provider Note   CSN: [id number] Arrival date & time: 05/20/23  2309     History  Chief Complaint  Patient presents with   Abdominal Pain   Flank Pain   HPI Samuel Patrick is a 57 y.o. 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  Started acutely an hour ago.  Feels sharp.  States the pain started in his groin but feels like it is radiating to his left lower quadrant and left flank.  Endorses nausea but no vomiting or diarrhea.  Denies any urinary symptoms.  States the pain does feel similar to when he had a kidney stone in the past.  Denies fever and chills.   Abdominal Pain Flank Pain Associated symptoms include abdominal pain.       Home Medications Prior to Admission medications   Medication Sig Start Date End Date Taking? Authorizing Provider  amoxicillin-clavulanate (AUGMENTIN) 875-125 MG tablet Take 1 tablet by mouth every 12 (twelve) hours. 01/23/23   Theron Arista, PA-C  ciprofloxacin (CIPRO) 500 MG tablet Take 500 mg by mouth 2 (two) times daily.    [provider]  ondansetron (ZOFRAN-ODT) 8 MG disintegrating tablet Take 1 tablet (8 mg total) by mouth every 8 (eight) hours as needed for nausea or vomiting. 01/23/23   Theron Arista, PA-C  oxyCODONE-acetaminophen (PERCOCET/ROXICET) 5-325 MG tablet Take 1 tablet by mouth every 6 (six) hours as needed for severe pain. 01/23/23   Theron Arista, PA-C  tamsulosin (FLOMAX) 0.4 MG CAPS capsule Take 0.4 mg by mouth daily. 03/11/21   [provider]  zolpidem (AMBIEN) 10 MG tablet Take 10 mg by mouth at bedtime. 04/17/18   [provider]      Allergies    Hydromorphone and Iodinated contrast media    Review of Systems   Review of Systems  Gastrointestinal:  Positive for abdominal pain.  Genitourinary:  Positive for flank pain.    Physical Exam Updated Vital Signs Temp 98.5 F (36.9 C) (Oral)   Wt  108.9 kg   BMI 37.60 kg/m  Physical Exam Vitals and nursing note reviewed.  HENT:     Head: Normocephalic and atraumatic.     Mouth/Throat:     Mouth: Mucous membranes are moist.  Eyes:     General:        Right eye: No discharge.        Left eye: No discharge.     Conjunctiva/sclera: Conjunctivae normal.  Cardiovascular:     Rate and Rhythm: Normal rate and regular rhythm.     Pulses: Normal pulses.     Heart sounds: Normal heart sounds.  Pulmonary:     Effort: Pulmonary effort is normal.     Breath sounds: Normal breath sounds.  Abdominal:     General: Abdomen is flat.     Palpations: Abdomen is soft.     Tenderness: There is abdominal tenderness in the left lower quadrant. There is left CVA tenderness.  Skin:    General: Skin is warm and dry.  Neurological:     General: No focal deficit present.  Psychiatric:        Mood and Affect: Mood normal.     ED Results / Procedures / Treatments   Labs (all labs ordered are listed, but only abnormal results are displayed) Labs Reviewed  CBC WITH DIFFERENTIAL/PLATELET  COMPREHENSIVE METABOLIC PANEL  LIPASE, BLOOD  URINALYSIS, ROUTINE W REFLEX MICROSCOPIC  EKG None  Radiology No results found.  Procedures Procedures    Medications Ordered in ED Medications  sodium chloride 0.9 % bolus 1,000 mL (has no administration in time range)  ondansetron (ZOFRAN) injection 4 mg (has no administration in time range)  morphine (PF) 4 MG/ML injection 5 mg (has no administration in time range)    ED Course/ Medical Decision Making/ A&P                                 Medical Decision Making  57 year old well-appearing male presenting for left lower quadrant abdominal pain, groin pain, and flank pain.  Exam notable for left lower quadrant tenderness and left CVA tenderness.  DDx includes nephrolithiasis, pyelonephritis, diverticulitis, appendicitis, UTI, other.  At this time, chief concern is nephrolithiasis plus or minus  associated infection.  Ordered CT renal stone study, volume resuscitating with normal saline, treating nausea with Zofran, treating pain with morphine.  Signed out patient to Dr. Manus Gunning who will continue to evaluate and manage.        Final Clinical Impression(s) / ED Diagnoses Final diagnoses:  Flank pain  Groin pain, left    Rx / DC Orders ED Discharge Orders     None         Gareth Eagle, PA-C 05/20/23 2327    Glynn Octave, MD 05/21/23 [phone]

## 2023-05-20 NOTE — ED Triage Notes (Signed)
Pt in with sharp L flank pain that radiates into LLQ and L groin, onset 1 hr ago. Hx of kidney stones, +nausea

## 2023-05-21 DIAGNOSIS — N200 Calculus of kidney: Secondary | ICD-10-CM | POA: Diagnosis not present

## 2023-05-21 LAB — COMPREHENSIVE METABOLIC PANEL WITH GFR
ALT: 18 U/L (ref 0–44)
AST: 15 U/L (ref 15–41)
Albumin: 4.1 g/dL (ref 3.5–5.0)
Alkaline Phosphatase: 69 U/L (ref 38–126)
Anion gap: 11 (ref 5–15)
BUN: 16 mg/dL (ref 6–20)
CO2: 21 mmol/L — ABNORMAL LOW (ref 22–32)
Calcium: 9.1 mg/dL (ref 8.9–10.3)
Chloride: 108 mmol/L (ref 98–111)
Creatinine, Ser: 1.46 mg/dL — ABNORMAL HIGH (ref 0.61–1.24)
GFR, Estimated: 56 mL/min — ABNORMAL LOW (ref >60)
Glucose, Bld: 146 mg/dL — ABNORMAL HIGH (ref 70–99)
Potassium: 3.6 mmol/L (ref 3.5–5.1)
Sodium: 140 mmol/L (ref 135–145)
Total Bilirubin: 0.4 mg/dL (ref 0.3–1.2)
Total Protein: 7.3 g/dL (ref 6.5–8.1)

## 2023-05-21 LAB — URINALYSIS, ROUTINE W REFLEX MICROSCOPIC
Bacteria, UA: NONE SEEN
Bilirubin Urine: NEGATIVE
Glucose, UA: NEGATIVE mg/dL
Ketones, ur: NEGATIVE mg/dL
Leukocytes,Ua: NEGATIVE
Nitrite: NEGATIVE
Specific Gravity, Urine: 1.028 (ref 1.005–1.030)
pH: 5.5 (ref 5.0–8.0)

## 2023-05-21 LAB — LIPASE, BLOOD: Lipase: 33 U/L (ref 11–51)

## 2023-05-21 MED ORDER — ONDANSETRON 4 MG PO TBDP: 4.0000 mg | ORAL_TABLET | Freq: Three times a day (TID) | ORAL | 0 refills | Status: DC | PRN

## 2023-05-21 MED ORDER — OXYCODONE-ACETAMINOPHEN 5-325 MG PO TABS: 1.0000 | ORAL_TABLET | Freq: Three times a day (TID) | ORAL | 0 refills | Status: DC | PRN

## 2023-05-21 MED ORDER — MORPHINE SULFATE (PF) 4 MG/ML IV SOLN
6.0000 mg | Freq: Once | INTRAVENOUS | Status: AC
  Administered 2023-05-21: 6 mg via INTRAVENOUS
  Filled 2023-05-21: qty 2

## 2023-05-21 MED ORDER — IBUPROFEN 800 MG PO TABS: 800.0000 mg | ORAL_TABLET | Freq: Three times a day (TID) | ORAL | 0 refills | Status: DC | PRN

## 2023-05-21 MED ORDER — AMOXICILLIN-POT CLAVULANATE 875-125 MG PO TABS: 1.0000 | ORAL_TABLET | Freq: Two times a day (BID) | ORAL | 0 refills | Status: DC

## 2023-05-21 NOTE — ED Notes (Signed)
Reviewed AVS with patient, patient expressed understanding of directions, denies further questions at this time. 

## 2023-05-21 NOTE — ED Provider Notes (Signed)
Left-sided flank and lower abdominal pain radiating to the groin for the past several hours consistent with previous kidney stone pain.  Nausea but no vomiting.  Urinalysis negative for infection.  Creatinine at baseline.  CT scan shows 2 mm left UVJ stone as well as several stones up in the left kidney.  Questionable diverticulitis of sigmoid colon as well. Results reviewed and interpreted by me.  Patient able to ambulate and tolerate p.o.  Pain is controlled.  Will treat for kidney stone and follow-up with urology.  Empiric antibiotics given possibility of diverticulitis.  Low suspicion for infected kidney stone at this time.  Return to the ED with severe pain, intractable vomiting, unable to urinate, fever or any other concerns.   Glynn Octave, MD 05/21/23 [phone]

## 2023-05-21 NOTE — ED Notes (Signed)
Provided pt with water for PO challenge. 

## 2023-05-21 NOTE — Discharge Instructions (Addendum)
CT scan shows a small kidney stone on the left side.  Take the pain and nausea medication as prescribed.  Follow-up with the urologist.  You are given antibiotics as well as there is a possibility of early diverticulitis involving your colon.  Follow-up with your primary doctor as well as the urologist.  Return to the ED with worsening pain, fever, not able to urinate, intractable vomiting, or any other concerns.

## 2023-05-21 NOTE — ED Notes (Signed)
Pt unable to give urine sample at this time 

## 2023-05-21 NOTE — ED Notes (Signed)
Pt returned from CT at this time.  

## 2023-05-21 NOTE — ED Notes (Signed)
Pt requested urinal but unable to provide urine specimen at this time.

## 2023-05-22 ENCOUNTER — Encounter (HOSPITAL_BASED_OUTPATIENT_CLINIC_OR_DEPARTMENT_OTHER): Payer: Self-pay

## 2023-05-22 ENCOUNTER — Emergency Department (HOSPITAL_BASED_OUTPATIENT_CLINIC_OR_DEPARTMENT_OTHER)
Admission: EM | Admit: 2023-05-22 | Discharge: 2023-05-22 | Discharge status: Against Medical Advice | Payer: BC Managed Care – PPO | Attending: Emergency Medicine | Admitting: Emergency Medicine

## 2023-05-22 ENCOUNTER — Other Ambulatory Visit: Payer: Self-pay

## 2023-05-22 DIAGNOSIS — Z5321 Procedure and treatment not carried out due to patient leaving prior to being seen by health care provider: Secondary | ICD-10-CM | POA: Insufficient documentation

## 2023-05-22 DIAGNOSIS — R109 Unspecified abdominal pain: Secondary | ICD-10-CM | POA: Diagnosis present

## 2023-05-22 NOTE — ED Notes (Signed)
Registration stated that this pt left.

## 2023-05-22 NOTE — ED Triage Notes (Signed)
Pt to ED c/o right flank pain, reports dx with kidney stones Saturday and was told to pass at home, reports pain unbearable.

## 2023-07-12 ENCOUNTER — Emergency Department (HOSPITAL_BASED_OUTPATIENT_CLINIC_OR_DEPARTMENT_OTHER): Payer: No Typology Code available for payment source

## 2023-07-12 ENCOUNTER — Encounter (HOSPITAL_BASED_OUTPATIENT_CLINIC_OR_DEPARTMENT_OTHER): Payer: Self-pay | Admitting: Emergency Medicine

## 2023-07-12 ENCOUNTER — Other Ambulatory Visit: Payer: Self-pay

## 2023-07-12 ENCOUNTER — Emergency Department (HOSPITAL_BASED_OUTPATIENT_CLINIC_OR_DEPARTMENT_OTHER)
Admission: EM | Admit: 2023-07-12 | Discharge: 2023-07-12 | Disposition: A | Discharge status: Home / Self Care | Payer: No Typology Code available for payment source | Attending: Emergency Medicine | Admitting: Emergency Medicine

## 2023-07-12 DIAGNOSIS — K5732 Diverticulitis of large intestine without perforation or abscess without bleeding: Secondary | ICD-10-CM | POA: Insufficient documentation

## 2023-07-12 DIAGNOSIS — R300 Dysuria: Secondary | ICD-10-CM | POA: Diagnosis not present

## 2023-07-12 DIAGNOSIS — R109 Unspecified abdominal pain: Secondary | ICD-10-CM | POA: Diagnosis present

## 2023-07-12 DIAGNOSIS — K5792 Diverticulitis of intestine, part unspecified, without perforation or abscess without bleeding: Secondary | ICD-10-CM

## 2023-07-12 HISTORY — DX: Malignant neoplasm of prostate: C61

## 2023-07-12 LAB — COMPREHENSIVE METABOLIC PANEL
ALT: 15 U/L (ref 0–44)
AST: 12 U/L — ABNORMAL LOW (ref 15–41)
Albumin: 4.2 g/dL (ref 3.5–5.0)
Alkaline Phosphatase: 77 U/L (ref 38–126)
Anion gap: 10 (ref 5–15)
BUN: 14 mg/dL (ref 6–20)
CO2: 19 mmol/L — ABNORMAL LOW (ref 22–32)
Calcium: 9.8 mg/dL (ref 8.9–10.3)
Chloride: 109 mmol/L (ref 98–111)
Creatinine, Ser: 1.23 mg/dL (ref 0.61–1.24)
GFR, Estimated: >60 mL/min (ref >60)
Glucose, Bld: 83 mg/dL (ref 70–99)
Potassium: 4 mmol/L (ref 3.5–5.1)
Sodium: 138 mmol/L (ref 135–145)
Total Bilirubin: 0.3 mg/dL (ref 0.3–1.2)
Total Protein: 8 g/dL (ref 6.5–8.1)

## 2023-07-12 LAB — CBC
HCT: 43.6 % (ref 39.0–52.0)
Hemoglobin: 13.9 g/dL (ref 13.0–17.0)
MCH: 24.5 pg — ABNORMAL LOW (ref 26.0–34.0)
MCHC: 31.9 g/dL (ref 30.0–36.0)
MCV: 76.9 fL — ABNORMAL LOW (ref 80.0–100.0)
Platelets: 311 10*3/uL (ref 150–400)
RBC: 5.67 MIL/uL (ref 4.22–5.81)
RDW: 16 % — ABNORMAL HIGH (ref 11.5–15.5)
WBC: 12.4 10*3/uL — ABNORMAL HIGH (ref 4.0–10.5)
nRBC: 0 % (ref 0.0–0.2)

## 2023-07-12 LAB — LIPASE, BLOOD: Lipase: 14 U/L (ref 11–51)

## 2023-07-12 MED ORDER — AMOXICILLIN-POT CLAVULANATE 875-125 MG PO TABS
1.0000 | ORAL_TABLET | Freq: Once | ORAL | Status: AC
  Administered 2023-07-12: 1 via ORAL
  Filled 2023-07-12: qty 1

## 2023-07-12 MED ORDER — OXYCODONE HCL 5 MG PO TABS
5.0000 mg | ORAL_TABLET | Freq: Once | ORAL | Status: AC
  Administered 2023-07-12: 5 mg via ORAL
  Filled 2023-07-12: qty 1

## 2023-07-12 MED ORDER — MORPHINE SULFATE (PF) 4 MG/ML IV SOLN
4.0000 mg | Freq: Once | INTRAVENOUS | Status: AC
  Administered 2023-07-12: 4 mg via INTRAVENOUS
  Filled 2023-07-12: qty 1

## 2023-07-12 MED ORDER — AMOXICILLIN-POT CLAVULANATE 875-125 MG PO TABS: 1.0000 | ORAL_TABLET | Freq: Three times a day (TID) | ORAL | 0 refills | Status: DC

## 2023-07-12 MED ORDER — ONDANSETRON HCL 4 MG/2ML IJ SOLN
4.0000 mg | Freq: Once | INTRAMUSCULAR | Status: AC
  Administered 2023-07-12: 4 mg via INTRAVENOUS
  Filled 2023-07-12: qty 2

## 2023-07-12 MED ORDER — OXYCODONE HCL 5 MG PO TABS
5.0000 mg | ORAL_TABLET | Freq: Three times a day (TID) | ORAL | 0 refills | Status: DC | PRN
Start: 2023-07-12 — End: 2023-08-02

## 2023-07-12 NOTE — Discharge Instructions (Signed)
You have been seen and discharged from the emergency department.  Your CAT scan identified a small segment of diverticulitis.  Take antibiotic as directed.  Take Tylenol/ibuprofen as needed for pain control.  You may use stronger pain medicine as needed.  Do not mix this medication with alcohol or other sedating medications. Do not drive or do heavy physical activity until you know how this medication affects you.  It may cause drowsiness.  Be mindful that taking this stronger pain medicine may cause constipation.  Follow-up with your primary provider for further evaluation and further care. Take home medications as prescribed. If you have any worsening symptoms or further concerns for your health please return to an emergency department for further evaluation.

## 2023-07-12 NOTE — ED Provider Notes (Signed)
[place] EMERGENCY DEPARTMENT AT [facility] Provider Note   CSN: [id number] Arrival date & time: 07/12/23  1603     History  No chief complaint on file.   Samuel Patrick is a 57 y.o. male.  HPI   57 year old male with past medical history of kidney stones presents to the emergency department with sudden onset right-sided abdominal/flank pain.  Patient states that started about an hour prior to arrival.  He had an episode of nausea and vomiting which is what prompted him for evaluation.  He is complaining of mild dysuria but denies any frequency or hematuria.  Last had a kidney stone a couple months ago that he believes he passed.  He is never required lithotripsy or stent before.  Denies any fever.  Home Medications Prior to Admission medications   Medication Sig Start Date End Date Taking? Authorizing Provider  amoxicillin-clavulanate (AUGMENTIN) 875-125 MG tablet Take 1 tablet by mouth every 12 (twelve) hours. 05/21/23   Rancour, Jeannett Senior, MD  ciprofloxacin (CIPRO) 500 MG tablet Take 500 mg by mouth 2 (two) times daily.    [provider]  ibuprofen (ADVIL) 800 MG tablet Take 1 tablet (800 mg total) by mouth every 8 (eight) hours as needed for moderate pain. 05/21/23   Rancour, Jeannett Senior, MD  ondansetron (ZOFRAN-ODT) 4 MG disintegrating tablet Take 1 tablet (4 mg total) by mouth every 8 (eight) hours as needed for nausea or vomiting. 05/21/23   Rancour, Jeannett Senior, MD  oxyCODONE-acetaminophen (PERCOCET/ROXICET) 5-325 MG tablet Take 1 tablet by mouth every 8 (eight) hours as needed for severe pain. 05/21/23   Rancour, Jeannett Senior, MD  tamsulosin (FLOMAX) 0.4 MG CAPS capsule Take 0.4 mg by mouth daily. 03/11/21   [provider]  zolpidem (AMBIEN) 10 MG tablet Take 10 mg by mouth at bedtime. 04/17/18   [provider]      Allergies    Hydromorphone and Iodinated contrast media    Review of Systems   Review of Systems  Constitutional:  Positive for chills  and fatigue. Negative for fever.  Respiratory:  Negative for shortness of breath.   Cardiovascular:  Negative for chest pain.  Gastrointestinal:  Positive for abdominal pain, nausea and vomiting. Negative for diarrhea.  Genitourinary:  Positive for dysuria and flank pain.  Skin:  Negative for rash.  Neurological:  Negative for headaches.    Physical Exam Updated Vital Signs BP (!) 116/92   Pulse 93   Temp 99.2 F (37.3 C) (Oral)   Resp 18   Wt 105.2 kg   SpO2 100%   BMI 36.34 kg/m  Physical Exam Vitals and nursing note reviewed.  Constitutional:      General: He is in acute distress.     Appearance: Normal appearance.  HENT:     Head: Normocephalic.     Mouth/Throat:     Mouth: Mucous membranes are moist.  Cardiovascular:     Rate and Rhythm: Normal rate.  Pulmonary:     Effort: Pulmonary effort is normal. No respiratory distress.  Abdominal:     Palpations: Abdomen is soft.     Tenderness: There is abdominal tenderness. There is no guarding or rebound.  Skin:    General: Skin is warm.  Neurological:     Mental Status: He is alert and oriented to person, place, and time. Mental status is at baseline.  Psychiatric:        Mood and Affect: Mood normal.     ED Results / Procedures / Treatments  Labs (all labs ordered are listed, but only abnormal results are displayed) Labs Reviewed  COMPREHENSIVE METABOLIC PANEL - Abnormal; Notable for the following components:      Result Value   CO2 19 (*)    AST 12 (*)    All other components within normal limits  CBC - Abnormal; Notable for the following components:   WBC 12.4 (*)    MCV 76.9 (*)    MCH 24.5 (*)    RDW 16.0 (*)    All other components within normal limits  LIPASE, BLOOD  URINALYSIS, ROUTINE W REFLEX MICROSCOPIC    EKG None  Radiology No results found.  Procedures Procedures    Medications Ordered in ED Medications  ondansetron (ZOFRAN) injection 4 mg (4 mg Intravenous Given 07/12/23 1708)   morphine (PF) 4 MG/ML injection 4 mg (4 mg Intravenous Given 07/12/23 1708)    ED Course/ Medical Decision Making/ A&P                                 Medical Decision Making Amount and/or Complexity of Data Reviewed Labs: ordered. Radiology: ordered.  Risk Prescription drug management.   57 year old male with past medical history of kidney stones presents emergency department with right-sided abdominal pain that radiates to the left.  Denies any acute fever.  Workup reveals a mild leukocytosis.  Metabolic panel is reassuring with no abnormal abdominal labs.  Lipase is normal.  Patient was unable to give a urine sample.  After IV pain medicine he feels improved.  CT renal stone study identifies no nephrolithiasis but does show a small segment of diverticulitis.  Patient will be treated appropriately with antibiotic and a pain regimen.  Will plan for outpatient follow-up.  Patient at this time appears safe and stable for discharge and close outpatient follow up. Discharge plan and strict return to ED precautions discussed, patient verbalizes understanding and agreement.        Final Clinical Impression(s) / ED Diagnoses Final diagnoses:  None    Rx / DC Orders ED Discharge Orders     None         Rozelle Logan, DO 07/12/23 2017

## 2023-07-12 NOTE — ED Notes (Signed)
Patient transported to CT 

## 2023-07-12 NOTE — ED Notes (Signed)
cr

## 2023-07-12 NOTE — ED Triage Notes (Signed)
Pt started with sharp /sudden pain right lower quad of abd and back about an hour ago, has hx kidney stones

## 2023-08-01 ENCOUNTER — Emergency Department (HOSPITAL_BASED_OUTPATIENT_CLINIC_OR_DEPARTMENT_OTHER): Payer: No Typology Code available for payment source

## 2023-08-01 ENCOUNTER — Encounter (HOSPITAL_BASED_OUTPATIENT_CLINIC_OR_DEPARTMENT_OTHER): Payer: Self-pay | Admitting: Emergency Medicine

## 2023-08-01 ENCOUNTER — Emergency Department (HOSPITAL_BASED_OUTPATIENT_CLINIC_OR_DEPARTMENT_OTHER)
Admission: EM | Admit: 2023-08-01 | Discharge: 2023-08-02 | Disposition: A | Discharge status: Home / Self Care | Payer: No Typology Code available for payment source | Attending: Emergency Medicine | Admitting: Emergency Medicine

## 2023-08-01 DIAGNOSIS — K5732 Diverticulitis of large intestine without perforation or abscess without bleeding: Secondary | ICD-10-CM | POA: Insufficient documentation

## 2023-08-01 DIAGNOSIS — Z8546 Personal history of malignant neoplasm of prostate: Secondary | ICD-10-CM | POA: Insufficient documentation

## 2023-08-01 DIAGNOSIS — K5792 Diverticulitis of intestine, part unspecified, without perforation or abscess without bleeding: Secondary | ICD-10-CM

## 2023-08-01 DIAGNOSIS — N189 Chronic kidney disease, unspecified: Secondary | ICD-10-CM | POA: Diagnosis not present

## 2023-08-01 DIAGNOSIS — K219 Gastro-esophageal reflux disease without esophagitis: Secondary | ICD-10-CM

## 2023-08-01 DIAGNOSIS — R1031 Right lower quadrant pain: Secondary | ICD-10-CM | POA: Diagnosis present

## 2023-08-01 HISTORY — DX: Gastro-esophageal reflux disease without esophagitis: K21.9

## 2023-08-01 LAB — COMPREHENSIVE METABOLIC PANEL
ALT: 15 U/L (ref 0–44)
AST: 16 U/L (ref 15–41)
Albumin: 4.4 g/dL (ref 3.5–5.0)
Alkaline Phosphatase: 81 U/L (ref 38–126)
Anion gap: 10 (ref 5–15)
BUN: 16 mg/dL (ref 6–20)
CO2: 22 mmol/L (ref 22–32)
Calcium: 9.9 mg/dL (ref 8.9–10.3)
Chloride: 106 mmol/L (ref 98–111)
Creatinine, Ser: 1.49 mg/dL — ABNORMAL HIGH (ref 0.61–1.24)
GFR, Estimated: 54 mL/min — ABNORMAL LOW (ref >60)
Glucose, Bld: 97 mg/dL (ref 70–99)
Potassium: 4.3 mmol/L (ref 3.5–5.1)
Sodium: 138 mmol/L (ref 135–145)
Total Bilirubin: 0.4 mg/dL (ref 0.3–1.2)
Total Protein: 8.3 g/dL — ABNORMAL HIGH (ref 6.5–8.1)

## 2023-08-01 LAB — CBC
HCT: 44 % (ref 39.0–52.0)
Hemoglobin: 13.9 g/dL (ref 13.0–17.0)
MCH: 24.1 pg — ABNORMAL LOW (ref 26.0–34.0)
MCHC: 31.6 g/dL (ref 30.0–36.0)
MCV: 76.4 fL — ABNORMAL LOW (ref 80.0–100.0)
Platelets: 278 10*3/uL (ref 150–400)
RBC: 5.76 MIL/uL (ref 4.22–5.81)
RDW: 16.1 % — ABNORMAL HIGH (ref 11.5–15.5)
WBC: 7 10*3/uL (ref 4.0–10.5)
nRBC: 0 % (ref 0.0–0.2)

## 2023-08-01 LAB — LIPASE, BLOOD: Lipase: 34 U/L (ref 11–51)

## 2023-08-01 MED ORDER — SODIUM CHLORIDE 0.9 % IV BOLUS
1000.0000 mL | Freq: Once | INTRAVENOUS | Status: AC
  Administered 2023-08-01: 1000 mL via INTRAVENOUS

## 2023-08-01 MED ORDER — DIPHENHYDRAMINE HCL 50 MG/ML IJ SOLN
25.0000 mg | Freq: Once | INTRAMUSCULAR | Status: AC
  Administered 2023-08-02: 25 mg via INTRAVENOUS
  Filled 2023-08-01: qty 1

## 2023-08-01 MED ORDER — ONDANSETRON HCL 4 MG/2ML IJ SOLN
4.0000 mg | Freq: Once | INTRAMUSCULAR | Status: AC
  Administered 2023-08-01: 4 mg via INTRAVENOUS
  Filled 2023-08-01: qty 2

## 2023-08-01 MED ORDER — MORPHINE SULFATE (PF) 4 MG/ML IV SOLN: 4.0000 mg | Freq: Once | INTRAVENOUS | Status: DC

## 2023-08-01 MED ORDER — MORPHINE SULFATE (PF) 4 MG/ML IV SOLN
4.0000 mg | Freq: Once | INTRAVENOUS | Status: AC
  Administered 2023-08-01: 4 mg via INTRAVENOUS
  Filled 2023-08-01: qty 1

## 2023-08-01 NOTE — ED Provider Triage Note (Signed)
Emergency Medicine Provider Triage Evaluation Note  Samuel Patrick , a 57 y.o. male  was evaluated in triage.  Pt complains of abdominal pain.  States this started over the weekend but much worse around 4 PM this afternoon.  Primarily in the right lower quadrant but radiates all about the lower abdomen.  Endorses some blood in the urine and blood in stool.  Reports a recent history of diverticulitis.  Also had a prostate biopsy about 2 weeks ago.  Was told at that time he could have some blood in his stool but was advised that should stop after 7 days.  Denies fever.  Endorses nausea but no vomiting diarrhea.  Review of Systems  Positive: See above Negative: See above  Physical Exam  BP (!) 148/97 (BP Location: Left Arm)   Pulse (!) 106   Temp 98.4 F (36.9 C) (Oral)   Resp (!) 24   SpO2 100%  Gen:   Awake, no distress   Resp:  Normal effort  MSK:   Moves extremities without difficulty  Other:    Medical Decision Making  Medically screening exam initiated at 8:45 PM.  Appropriate orders placed.  Samuel Patrick was informed that the remainder of the evaluation will be completed by another provider, this initial triage assessment does not replace that evaluation, and the importance of remaining in the ED until their evaluation is complete.  Work up started   Gareth Eagle, [place] 08/01/23 2047

## 2023-08-01 NOTE — ED Provider Notes (Signed)
[place] EMERGENCY DEPARTMENT AT [facility] Provider Note   CSN: [id number] Arrival date & time: 08/01/23  1947     History  Chief Complaint  Patient presents with   Abdominal Pain   HPI Samuel Patrick is a 57 y.o. male with recent diagnosis of diverticulitis, prostate cancer status post recent biopsy, CKD presenting for abdominal pain.  States this started over the weekend but much worse around 4 PM this afternoon.  Primarily in the right lower quadrant but radiates all about the lower abdomen.  Endorses some blood in the urine and blood in stool.  Reports a recent history of diverticulitis.  Also had a prostate biopsy about 2 weeks ago.  Was told at that time he could have some blood in his stool but was advised that should stop after 7 days.  Denies fever.  Endorses nausea but no vomiting diarrhea.    Abdominal Pain      Home Medications Prior to Admission medications   Medication Sig Start Date End Date Taking? Authorizing Provider  oxyCODONE (ROXICODONE) 5 MG immediate release tablet Take 1 tablet (5 mg total) by mouth every 4 (four) hours as needed for severe pain (pain score 7-10). 08/02/23  Yes Gareth Eagle, PA-C  amoxicillin-clavulanate (AUGMENTIN) 875-125 MG tablet Take 1 tablet by mouth every 8 (eight) hours. 07/12/23   Horton, Clabe Seal, DO  ciprofloxacin (CIPRO) 500 MG tablet Take 500 mg by mouth 2 (two) times daily.    [provider]  ibuprofen (ADVIL) 800 MG tablet Take 1 tablet (800 mg total) by mouth every 8 (eight) hours as needed for moderate pain. 05/21/23   Rancour, Jeannett Senior, MD  ondansetron (ZOFRAN-ODT) 4 MG disintegrating tablet Take 1 tablet (4 mg total) by mouth every 8 (eight) hours as needed for nausea or vomiting. 05/21/23   Rancour, Jeannett Senior, MD  tamsulosin (FLOMAX) 0.4 MG CAPS capsule Take 0.4 mg by mouth daily. 03/11/21   [provider]  zolpidem (AMBIEN) 10 MG tablet Take 10 mg by mouth at bedtime. 04/17/18   [provider]      Allergies    Hydromorphone and Iodinated contrast media    Review of Systems   Review of Systems  Gastrointestinal:  Positive for abdominal pain.    Physical Exam   Vitals:   08/01/23 2019 08/01/23 2330  BP: (!) 148/97 131/79  Pulse: (!) 106 94  Resp: (!) 24 (!) 22  Temp: 98.4 F (36.9 C)   SpO2: 100% 98%    CONSTITUTIONAL:  well-appearing, NAD NEURO:  Alert and oriented x 3, CN 3-12 grossly intact EYES:  eyes equal and reactive ENT/NECK:  Supple, no stridor  CARDIO:  Regular rate and rhythm, appears well-perfused  PULM:  No respiratory distress, CTAB GI/GU:  non-distended, soft, lower abdominal tenderness MSK/SPINE:  No gross deformities, no edema, moves all extremities  SKIN:  no rash, atraumatic  *Additional and/or pertinent findings included in MDM below  ED Results / Procedures / Treatments   Labs (all labs ordered are listed, but only abnormal results are displayed) Labs Reviewed  COMPREHENSIVE METABOLIC PANEL - Abnormal; Notable for the following components:      Result Value   Creatinine, Ser 1.49 (*)    Total Protein 8.3 (*)    GFR, Estimated 54 (*)    All other components within normal limits  CBC - Abnormal; Notable for the following components:   MCV 76.4 (*)    MCH 24.1 (*)    RDW  16.1 (*)    All other components within normal limits  LIPASE, BLOOD  URINALYSIS, ROUTINE W REFLEX MICROSCOPIC    EKG None  Radiology CT RENAL STONE STUDY  Result Date: 08/01/2023 CLINICAL DATA:  Right lower quadrant pain. EXAM: CT ABDOMEN AND PELVIS WITHOUT CONTRAST TECHNIQUE: Multidetector CT imaging of the abdomen and pelvis was performed following the standard protocol without IV contrast. RADIATION DOSE REDUCTION: This exam was performed according to the departmental dose-optimization program which includes automated exposure control, adjustment of the mA and/or kV according to patient size and/or use of iterative reconstruction technique.  COMPARISON:  CT renal stone 07/12/2023 FINDINGS: Lower chest: No acute abnormality. Hepatobiliary: Gallstones are present. There is no biliary ductal dilatation. The liver is within normal limits. Pancreas: Unremarkable. No pancreatic ductal dilatation or surrounding inflammatory changes. Spleen: Normal in size without focal abnormality. Adrenals/Urinary Tract: There are punctate nonobstructing left renal calculi measuring up to 4 mm. Otherwise, the bladder, kidneys and adrenal glands are within normal limits. Stomach/Bowel: There is wall thickening and inflammation of the proximal sigmoid colon where there are multiple colonic diverticula worrisome for diverticulitis. There is no perforation or abscess. There is no bowel obstruction. The appendix appears within limits. Small bowel and stomach are within normal limits. Vascular/Lymphatic: No significant vascular findings are present. No enlarged abdominal or pelvic lymph nodes. Reproductive: Prostate gland is enlarged. Other: No abdominal wall hernia or abnormality. No abdominopelvic ascites. Musculoskeletal: No acute or significant osseous findings. IMPRESSION: 1. Acute uncomplicated sigmoid diverticulitis. Follow-up colonoscopy should be considered when acute symptoms resolve to resolve to exclude underlying pathology. 2. Nonobstructing left renal calculi. 3. Cholelithiasis. Electronically Signed   By: Darliss Cheney M.D.   On: 08/01/2023 23:51    Procedures Procedures    Medications Ordered in ED Medications  diphenhydrAMINE (BENADRYL) injection 25 mg (has no administration in time range)  sodium chloride 0.9 % bolus 1,000 mL (0 mLs Intravenous Stopped 08/02/23 0016)  ondansetron (ZOFRAN) injection 4 mg (4 mg Intravenous Given 08/01/23 2120)  morphine (PF) 4 MG/ML injection 4 mg (4 mg Intravenous Given 08/01/23 2121)  oxyCODONE (Oxy IR/ROXICODONE) immediate release tablet 5 mg (5 mg Oral Given 08/02/23 0016)    ED Course/ Medical Decision Making/  A&P                                 Medical Decision Making Amount and/or Complexity of Data Reviewed Labs: ordered. Radiology: ordered.  Risk Prescription drug management.   Initial Impression and Ddx 57 year old well-appearing male presenting for abdominal pain.  Exam notable for lower abdominal tenderness.  DDx includes diverticulitis, nephrolithiasis, pyelonephritis, bowel obstruction or bowel perforation. Patient PMH that increases complexity of ED encounter:  recent diagnosis of diverticulitis, prostate cancer status post recent biopsy, CKD   Interpretation of Diagnostics - I independent reviewed and interpreted the labs as followed: Slightly elevated creatinine above baseline  - I independently visualized the following imaging with scope of interpretation limited to determining acute life threatening conditions related to emergency care: CT renal, which revealed uncomplicated sigmoid diverticulitis, no kidney stone  Patient Reassessment and Ultimate Disposition/Management On reassessment, patient stated that pain had improved.  Chart review revealed that he has had diverticulitis multiple times.  Most recently diagnosed on October 2.  Was on a course of Augmentin and improved.  His pain returned this past couple days and his PCP started him on Cipro and Flagyl which he reports  starting today.  Given reassuring workup, felt he was appropriate to discharge and patient does have close follow-up with [facility].  Advised him to call them in the morning and schedule an appointment, continue his Cipro and Flagyl as prescribed to him, discussed per return precautions.  Vitals remained stable.  Discharged home in good condition.  Also sent a few tablets of Norco to his pharmacy for acute pain.  Patient management required discussion with the following services or consulting groups:  None  Complexity of Problems Addressed Acute complicated illness or Injury  Additional Data Reviewed and  Analyzed Further history obtained from: Prior ED visit notes, Recent discharge summary, and Recent PCP notes  Patient Encounter Risk Assessment Prescriptions         Final Clinical Impression(s) / ED Diagnoses Final diagnoses:  Diverticulitis    Rx / DC Orders ED Discharge Orders          Ordered    oxyCODONE (ROXICODONE) 5 MG immediate release tablet  Every 4 hours PRN        08/02/23 0019              Gareth Eagle, PA-C 08/02/23 0020    Tegeler, Canary Brim, MD 08/02/23 [phone]

## 2023-08-01 NOTE — ED Triage Notes (Signed)
Right side abdo pain. Pain x "couple days" Seen by pcp given abt for diverticulitis. Pain worse

## 2023-08-02 MED ORDER — OXYCODONE HCL 5 MG PO TABS: 5.0000 mg | ORAL_TABLET | ORAL | 0 refills | Status: DC | PRN

## 2023-08-02 MED ORDER — OXYCODONE HCL 5 MG PO TABS
5.0000 mg | ORAL_TABLET | Freq: Once | ORAL | Status: AC
  Administered 2023-08-02: 5 mg via ORAL
  Filled 2023-08-02: qty 1

## 2023-08-02 NOTE — Discharge Instructions (Addendum)
Evaluation today revealed that you have uncomplicated diverticulitis.  Recommend you continue taking Cipro and Flagyl as prescribed by her PCP.  I sent a couple tablets of oxycodone to your pharmacy for acute pain.  Also recommend you follow-up with your [facility] medical provider.  If symptoms worsen please return emergency department further evaluation.

## 2023-08-22 ENCOUNTER — Other Ambulatory Visit: Payer: Self-pay

## 2023-08-22 ENCOUNTER — Emergency Department (HOSPITAL_BASED_OUTPATIENT_CLINIC_OR_DEPARTMENT_OTHER)
Admission: EM | Admit: 2023-08-22 | Discharge: 2023-08-22 | Discharge status: Against Medical Advice | Payer: BLUE CROSS/BLUE SHIELD | Attending: Emergency Medicine | Admitting: Emergency Medicine

## 2023-08-22 DIAGNOSIS — Z5321 Procedure and treatment not carried out due to patient leaving prior to being seen by health care provider: Secondary | ICD-10-CM | POA: Insufficient documentation

## 2023-08-22 DIAGNOSIS — R1032 Left lower quadrant pain: Secondary | ICD-10-CM | POA: Insufficient documentation

## 2023-08-22 DIAGNOSIS — K920 Hematemesis: Secondary | ICD-10-CM | POA: Insufficient documentation

## 2023-08-22 DIAGNOSIS — R195 Other fecal abnormalities: Secondary | ICD-10-CM | POA: Diagnosis not present

## 2023-08-22 LAB — CBC
HCT: 43.5 % (ref 39.0–52.0)
Hemoglobin: 13.9 g/dL (ref 13.0–17.0)
MCH: 24.3 pg — ABNORMAL LOW (ref 26.0–34.0)
MCHC: 32 g/dL (ref 30.0–36.0)
MCV: 75.9 fL — ABNORMAL LOW (ref 80.0–100.0)
Platelets: 327 10*3/uL (ref 150–400)
RBC: 5.73 MIL/uL (ref 4.22–5.81)
RDW: 17.4 % — ABNORMAL HIGH (ref 11.5–15.5)
WBC: 5.1 10*3/uL (ref 4.0–10.5)
nRBC: 0 % (ref 0.0–0.2)

## 2023-08-22 LAB — COMPREHENSIVE METABOLIC PANEL
ALT: 26 U/L (ref 0–44)
AST: 21 U/L (ref 15–41)
Albumin: 4.4 g/dL (ref 3.5–5.0)
Alkaline Phosphatase: 65 U/L (ref 38–126)
Anion gap: 10 (ref 5–15)
BUN: 12 mg/dL (ref 6–20)
CO2: 22 mmol/L (ref 22–32)
Calcium: 9.3 mg/dL (ref 8.9–10.3)
Chloride: 108 mmol/L (ref 98–111)
Creatinine, Ser: 1.27 mg/dL — ABNORMAL HIGH (ref 0.61–1.24)
GFR, Estimated: >60 mL/min (ref >60)
Glucose, Bld: 98 mg/dL (ref 70–99)
Potassium: 3.8 mmol/L (ref 3.5–5.1)
Sodium: 140 mmol/L (ref 135–145)
Total Bilirubin: 0.4 mg/dL (ref <1.2)
Total Protein: 8.1 g/dL (ref 6.5–8.1)

## 2023-08-22 LAB — LIPASE, BLOOD: Lipase: 25 U/L (ref 11–51)

## 2023-08-22 NOTE — ED Triage Notes (Signed)
C/o of LLQ pain starting this morning. States had episodes of dark stools and vomiting blood. Hx of diverticulitis. States this feels different than his normal flare ups.

## 2024-04-15 DIAGNOSIS — N400 Enlarged prostate without lower urinary tract symptoms: Secondary | ICD-10-CM

## 2024-04-15 HISTORY — DX: Benign prostatic hyperplasia without lower urinary tract symptoms: N40.0

## 2024-07-17 ENCOUNTER — Other Ambulatory Visit: Payer: Self-pay | Admitting: Otolaryngology

## 2024-07-24 ENCOUNTER — Encounter (HOSPITAL_COMMUNITY): Payer: Self-pay | Admitting: Otolaryngology

## 2024-07-24 NOTE — Progress Notes (Signed)
 Patient states Dx with positive Covid.  Needs to cancel 07/25/24 surgery.  Asked patient to call MD's office to let them know of cancellation.  MD's office phone number given to patient.  Instructed patient to call back when he is ready to reschedule the surgery.  Patient verbalized understanding.    PCP - Dr Bernarda Favorite Cardiologist - none [place] - Dr [facility] [place] Mail [place], [place]  Chest x-ray - n/a EKG - n/a Stress Test - 01/28/15 CE ECHO - 01/27/15 CE Cardiac Cath - n/a  ICD Pacemaker/Loop - n/a  Sleep Study -  Yes (03/2015) CPAP - occasional uses CPAP  Pre-Diabetes - no meds  Aspirin & Blood Thinner Instructions:  n/a  ERAS - clear liquids til 0730 DOS.  Anesthesia review: no

## 2024-07-25 ENCOUNTER — Ambulatory Visit (HOSPITAL_COMMUNITY): Admission: RE | Admit: 2024-07-25 | Source: Home / Self Care | Admitting: Otolaryngology

## 2024-07-25 SURGERY — TONSILLECTOMY AND ADENOIDECTOMY
Anesthesia: General | Laterality: Bilateral

## 2024-08-12 NOTE — Telephone Encounter (Signed)
 Remind patient CPE due in January. Electronically signed by: Bernarda JAYSON Smitty Jackquline, MD 08/12/2024 7:52 AM

## 2024-08-15 ENCOUNTER — Emergency Department (HOSPITAL_BASED_OUTPATIENT_CLINIC_OR_DEPARTMENT_OTHER): Admitting: Radiology

## 2024-08-15 ENCOUNTER — Encounter (HOSPITAL_BASED_OUTPATIENT_CLINIC_OR_DEPARTMENT_OTHER): Payer: Self-pay | Admitting: Emergency Medicine

## 2024-08-15 ENCOUNTER — Emergency Department (HOSPITAL_COMMUNITY)

## 2024-08-15 ENCOUNTER — Emergency Department (HOSPITAL_BASED_OUTPATIENT_CLINIC_OR_DEPARTMENT_OTHER)

## 2024-08-15 ENCOUNTER — Other Ambulatory Visit: Payer: Self-pay

## 2024-08-15 ENCOUNTER — Inpatient Hospital Stay (HOSPITAL_BASED_OUTPATIENT_CLINIC_OR_DEPARTMENT_OTHER)
Admission: EM | Admit: 2024-08-15 | Discharge: 2024-08-19 | DRG: 418 | Disposition: A | Discharge status: Home / Self Care | Attending: Family Medicine | Admitting: Family Medicine

## 2024-08-15 DIAGNOSIS — R112 Nausea with vomiting, unspecified: Secondary | ICD-10-CM

## 2024-08-15 DIAGNOSIS — E785 Hyperlipidemia, unspecified: Secondary | ICD-10-CM | POA: Diagnosis present

## 2024-08-15 DIAGNOSIS — E669 Obesity, unspecified: Secondary | ICD-10-CM | POA: Diagnosis present

## 2024-08-15 DIAGNOSIS — Z1152 Encounter for screening for COVID-19: Secondary | ICD-10-CM

## 2024-08-15 DIAGNOSIS — N1831 Chronic kidney disease, stage 3a: Secondary | ICD-10-CM | POA: Diagnosis present

## 2024-08-15 DIAGNOSIS — Z79899 Other long term (current) drug therapy: Secondary | ICD-10-CM | POA: Diagnosis not present

## 2024-08-15 DIAGNOSIS — R0600 Dyspnea, unspecified: Secondary | ICD-10-CM

## 2024-08-15 DIAGNOSIS — F419 Anxiety disorder, unspecified: Secondary | ICD-10-CM | POA: Diagnosis present

## 2024-08-15 DIAGNOSIS — R1010 Upper abdominal pain, unspecified: Secondary | ICD-10-CM

## 2024-08-15 DIAGNOSIS — F321 Major depressive disorder, single episode, moderate: Secondary | ICD-10-CM | POA: Diagnosis present

## 2024-08-15 DIAGNOSIS — G4733 Obstructive sleep apnea (adult) (pediatric): Secondary | ICD-10-CM | POA: Diagnosis present

## 2024-08-15 DIAGNOSIS — K8000 Calculus of gallbladder with acute cholecystitis without obstruction: Principal | ICD-10-CM | POA: Diagnosis present

## 2024-08-15 DIAGNOSIS — K219 Gastro-esophageal reflux disease without esophagitis: Secondary | ICD-10-CM | POA: Diagnosis present

## 2024-08-15 DIAGNOSIS — K8050 Calculus of bile duct without cholangitis or cholecystitis without obstruction: Secondary | ICD-10-CM | POA: Diagnosis present

## 2024-08-15 DIAGNOSIS — Z91041 Radiographic dye allergy status: Secondary | ICD-10-CM | POA: Diagnosis not present

## 2024-08-15 DIAGNOSIS — Z6836 Body mass index (BMI) 36.0-36.9, adult: Secondary | ICD-10-CM

## 2024-08-15 DIAGNOSIS — R079 Chest pain, unspecified: Secondary | ICD-10-CM

## 2024-08-15 DIAGNOSIS — K8020 Calculus of gallbladder without cholecystitis without obstruction: Principal | ICD-10-CM

## 2024-08-15 DIAGNOSIS — D500 Iron deficiency anemia secondary to blood loss (chronic): Secondary | ICD-10-CM | POA: Diagnosis present

## 2024-08-15 DIAGNOSIS — Z885 Allergy status to narcotic agent status: Secondary | ICD-10-CM | POA: Diagnosis not present

## 2024-08-15 DIAGNOSIS — Z8249 Family history of ischemic heart disease and other diseases of the circulatory system: Secondary | ICD-10-CM | POA: Diagnosis not present

## 2024-08-15 DIAGNOSIS — N401 Enlarged prostate with lower urinary tract symptoms: Secondary | ICD-10-CM | POA: Diagnosis present

## 2024-08-15 DIAGNOSIS — Z8546 Personal history of malignant neoplasm of prostate: Secondary | ICD-10-CM | POA: Diagnosis not present

## 2024-08-15 DIAGNOSIS — R7303 Prediabetes: Secondary | ICD-10-CM | POA: Diagnosis present

## 2024-08-15 LAB — CBC
HCT: 43.1 % (ref 39.0–52.0)
Hemoglobin: 13.4 g/dL (ref 13.0–17.0)
MCH: 23.8 pg — ABNORMAL LOW (ref 26.0–34.0)
MCHC: 31.1 g/dL (ref 30.0–36.0)
MCV: 76.7 fL — ABNORMAL LOW (ref 80.0–100.0)
Platelets: 241 K/uL (ref 150–400)
RBC: 5.62 MIL/uL (ref 4.22–5.81)
RDW: 15.8 % — ABNORMAL HIGH (ref 11.5–15.5)
WBC: 4.3 K/uL (ref 4.0–10.5)
nRBC: 0 % (ref 0.0–0.2)

## 2024-08-15 LAB — BASIC METABOLIC PANEL WITH GFR
Anion gap: 10 (ref 5–15)
BUN: 14 mg/dL (ref 6–20)
CO2: 25 mmol/L (ref 22–32)
Calcium: 9.8 mg/dL (ref 8.9–10.3)
Chloride: 107 mmol/L (ref 98–111)
Creatinine, Ser: 1.55 mg/dL — ABNORMAL HIGH (ref 0.61–1.24)
GFR, Estimated: 52 mL/min — ABNORMAL LOW (ref >60)
Glucose, Bld: 105 mg/dL — ABNORMAL HIGH (ref 70–99)
Potassium: 4.3 mmol/L (ref 3.5–5.1)
Sodium: 141 mmol/L (ref 135–145)

## 2024-08-15 LAB — LIPASE, BLOOD: Lipase: 32 U/L (ref 11–51)

## 2024-08-15 LAB — HEPATIC FUNCTION PANEL
ALT: 15 U/L (ref 0–44)
AST: 24 U/L (ref 15–41)
Albumin: 4.3 g/dL (ref 3.5–5.0)
Alkaline Phosphatase: 120 U/L (ref 38–126)
Bilirubin, Direct: <0.1 mg/dL (ref 0.0–0.2)
Total Bilirubin: 0.3 mg/dL (ref 0.0–1.2)
Total Protein: 7.7 g/dL (ref 6.5–8.1)

## 2024-08-15 LAB — TROPONIN T, HIGH SENSITIVITY
Troponin T High Sensitivity: <15 ng/L (ref 0–19)
Troponin T High Sensitivity: <15 ng/L (ref 0–19)

## 2024-08-15 LAB — RESP PANEL BY RT-PCR (RSV, FLU A&B, COVID)  RVPGX2
Influenza A by PCR: NEGATIVE
Influenza B by PCR: NEGATIVE
Resp Syncytial Virus by PCR: NEGATIVE
SARS Coronavirus 2 by RT PCR: NEGATIVE

## 2024-08-15 LAB — PRO BRAIN NATRIURETIC PEPTIDE: Pro Brain Natriuretic Peptide: <50 pg/mL (ref <300.0)

## 2024-08-15 MED ORDER — IOHEXOL 350 MG/ML SOLN
75.0000 mL | Freq: Once | INTRAVENOUS | Status: AC | PRN
  Administered 2024-08-15: 75 mL via INTRAVENOUS

## 2024-08-15 MED ORDER — NITROGLYCERIN 0.4 MG SL SUBL
0.4000 mg | SUBLINGUAL_TABLET | SUBLINGUAL | Status: DC | PRN
  Filled 2024-08-15: qty 1

## 2024-08-15 MED ORDER — MORPHINE SULFATE (PF) 4 MG/ML IV SOLN
4.0000 mg | Freq: Once | INTRAVENOUS | Status: AC
  Administered 2024-08-15: 4 mg via INTRAVENOUS
  Filled 2024-08-15: qty 1

## 2024-08-15 MED ORDER — LORAZEPAM 2 MG/ML IJ SOLN
1.0000 mg | Freq: Once | INTRAMUSCULAR | Status: AC
  Administered 2024-08-15: 1 mg via INTRAVENOUS
  Filled 2024-08-15: qty 1

## 2024-08-15 MED ORDER — DIPHENHYDRAMINE HCL 25 MG PO CAPS: 50.0000 mg | ORAL_CAPSULE | Freq: Once | ORAL | Status: AC

## 2024-08-15 MED ORDER — SODIUM CHLORIDE 0.9 % IV BOLUS
1000.0000 mL | Freq: Once | INTRAVENOUS | Status: AC
  Administered 2024-08-15: 1000 mL via INTRAVENOUS

## 2024-08-15 MED ORDER — FENTANYL CITRATE (PF) 50 MCG/ML IJ SOSY
50.0000 ug | PREFILLED_SYRINGE | Freq: Once | INTRAMUSCULAR | Status: AC
  Administered 2024-08-15: 50 ug via INTRAVENOUS
  Filled 2024-08-15: qty 1

## 2024-08-15 MED ORDER — DIPHENHYDRAMINE HCL 50 MG/ML IJ SOLN
50.0000 mg | Freq: Once | INTRAMUSCULAR | Status: AC
  Administered 2024-08-15: 50 mg via INTRAVENOUS
  Filled 2024-08-15: qty 1

## 2024-08-15 MED ORDER — ASPIRIN 81 MG PO CHEW
324.0000 mg | CHEWABLE_TABLET | Freq: Once | ORAL | Status: AC
  Administered 2024-08-15: 324 mg via ORAL
  Filled 2024-08-15: qty 4

## 2024-08-15 MED ORDER — ONDANSETRON HCL 4 MG/2ML IJ SOLN
4.0000 mg | Freq: Once | INTRAMUSCULAR | Status: AC
  Administered 2024-08-15: 4 mg via INTRAVENOUS
  Filled 2024-08-15: qty 2

## 2024-08-15 MED ORDER — METHYLPREDNISOLONE SODIUM SUCC 40 MG IJ SOLR
40.0000 mg | Freq: Once | INTRAMUSCULAR | Status: AC
  Administered 2024-08-15: 40 mg via INTRAVENOUS
  Filled 2024-08-15: qty 1

## 2024-08-15 NOTE — ED Notes (Signed)
 Patient BIB [facility] as a transfer from [facility] to rule out Cholecystitis by CT. However patient has a contrast allergy which required transfer to our ED. Patient reports 7/10 upper LQ abd pain currently. Received Fentanyl  for pain management just prior to arrival. Solumedrol also given at 1753 for contrast allergy.

## 2024-08-15 NOTE — ED Provider Notes (Signed)
 Patient signed out to me by previous provider. Please refer to their note for full HPI.  Briefly this is a 58 year old male who presents to the emergency department as an ER to ER transfer for concern of intractable abdominal pain and rule out dissection.  Patient has history of contrast allergy and is in the middle of his pretreatment regimen.   Patient signed out pending CT imaging and consultation.   ED Course: CT imaging shows no vascular abnormality.  Confirms cholelithiasis without findings of cholecystitis.  Patient is still symptomatic, requiring further pain medicine.  Will plan to admit to medicine and reconsult general surgery for symptomatic cholelithiasis with intractable pain.  Patients evaluation and results requires admission for further treatment and care.  Spoke with hospitalist, reviewed patient's ED course and they accept admission.  Patient agrees with admission plan, offers no new complaints and is stable/unchanged at time of admit.   Bari Roxie HERO, DO 08/15/24 2254

## 2024-08-15 NOTE — ED Notes (Signed)
 [facility] at bedside

## 2024-08-15 NOTE — ED Notes (Signed)
 Spoke to CT, will plan for CT scan at 10 pm. Benadryl  administration at 2100.

## 2024-08-15 NOTE — Progress Notes (Signed)
 [facility] Patient Information  Patient State (at time of visit): [place]  Patient Location (at time of visit):Home/Other Non-Medical  Provider Location: [place]  Is provider licensed to provide clinical care in the current location/state of the patient? Yes Consent  Patient's identity was confirmed. Presenting condition or illness was discussed with the patient/personal representative. Current proposed treatment for presenting condition or illness was explained to patient/personal representative along with the likely benefits and any significant risks or complications associated with the provision of treatment by audio/video means. The patient/personal representative verbally authorized treatment to be provided by audio/video, which may include a limited review of patient's current health status, medication, or other treatment recommendations, patient education, and an opportunity to ask questions about condition and treatment. Verbal Consent Granted by Patient/Personal Representative:Yes  Video start time/video stop time: Start time: 08/15/2024  2:10 PM EST End time: 08/15/2024  2:14 PM EST Video duration: 109m 51s   History of Present Illness  Samuel Patrick is a 58 y.o. male  History of Present Illness The patient presents for evaluation of chest pressure, shortness of breath, congestion, and neck pain.  He has been experiencing chest pressure and significant nasal congestion over the past few days. 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 He subsequently traveled to [place]  by bus, where he delivered another sermon, and returned home yesterday. He reports persistent discomfort since then. He describes his chest as feeling heavy and reports dyspnea, particularly when lying down at night, which has disrupted his sleep.He denies prior history of cardiac issues.  Respiratory function improves when he is in an upright position, although mild chest pressure persists. He has been maintaining an upright posture more frequently, even during the night. He also reports a head cold but notes that his current symptoms feel different. There is no dizziness or nausea.  Additionally, he reports mild neck soreness, predominantly on the left side, which he does not believe to be muscular in nature. This symptom onset was last night.  Occupation: Pastor Sleep: Disrupted sleep due to dyspnea, worsens when lying down   Behavioral Health Screening  Patient Health Questionnaire-2 Score: 0 (08/15/2024  1:27 PM)      Patient's Depression screening is Negative   Depression Plan: Normal/Negative Screening Medications prior to encounter: Medications Ordered Prior to Encounter[1]  Video Exam  Physical Exam Constitutional:      General: He is not in acute distress.    Appearance: Normal appearance.  HENT:     Right Ear: External ear normal.     Left Ear: External ear normal.     Nose: Nose normal.  Eyes:     Extraocular Movements: Extraocular movements intact.     Conjunctiva/sclera: Conjunctivae normal.  Pulmonary:     Effort: Pulmonary effort is normal. No respiratory distress.     Comments: Patient speaking in fluid and complete sentences. Musculoskeletal:     Cervical back: Neck supple.  Neurological:     General: No focal deficit present.     Mental Status: He is alert.  Psychiatric:        Mood and Affect: Mood normal.        Behavior: Behavior normal.    Diagnosis and Medical Decision Making  1. Chest pressure (Primary)  2. Shortness of breath   Assessment & Plan Given concerns about chest pressure, shortness of breath, and left sided neck pain, along with positional dyspnea  and overall malaise, discussed with patient that the safest course of action would be to be evaluated at the ER to discern between respiratory or potential cardiac issues. -  The patient is advised to seek immediate medical attention at the emergency room for further evaluation. A family member will drive him now. Medications Ordered   No orders of the defined types were placed in this encounter.   Disposition  Disposition:  Patient referred to emergency room for additional evaluation and management.  If condition(s) worsens, or not improving [name] needs re-evaluation  For questions or concerns regarding this visit, patient should contact [facility] via message or at [phone].      [1] Current Outpatient Medications on File Prior to Visit  Medication Sig Dispense Refill  . albuterol HFA (PROVENTIL HFA;VENTOLIN HFA;PROAIR HFA) 90 mcg/actuation inhaler Inhale 2 puffs every 6 (six) hours as needed for wheezing or shortness of breath. 1 each 0  . alfuzosin (UROXATRAL) 10 mg Tb24 24 hour tablet Take 1 tablet (10 mg total) by mouth daily. 90 tablet 3  . escitalopram (LEXAPRO) 10 mg tablet Take 1 tablet (10 mg total) by mouth daily. 90 tablet 3  . meloxicam (MOBIC) 7.5 mg tablet Take 1 tablet (7.5 mg total) by mouth daily as needed for mild pain (1-3). Take with food 7 tablet 0  . oxyCODONE -acetaminophen  (Percocet) 5-325 mg per tablet Take 1 tablet by mouth every 4 (four) hours as needed for moderate pain (4-6). 20 tablet 0  . sildenafiL (VIAGRA) 100 mg tablet Take 1 tablet (100 mg total) by mouth daily as needed for erectile dysfunction (Take one hour prior to intimacy on a near empty stomach, avoid alcohol. Do not use nitroglycerin  while taking this medications.). 10 tablet 11  . suzetrigine (Journavx) 50 mg tab Take 1 tablet (50 mg total) by mouth every 12 (twelve) hours. Take for moderate to severe pain after surgery 10 tablet 1  . triamcinolone acetonide (KENALOG) 0.1 % ointment Apply topically 2 (two) times a day as needed (itching). X7 days then prn 30 g 0  . zolpidem  (AMBIEN ) 10 mg tablet TAKE 1 TABLET BY MOUTH AT BEDTIME AS NEEDED FOR SLEEP 30  tablet 2   No current facility-administered medications on file prior to visit.

## 2024-08-15 NOTE — Consult Note (Signed)
 Reason for Consult:  Cholelithiasis, ?biliary colic Referring Provider: Bari, MD  HPI  Faith Branan is an 58 y.o. male with history of nephrolithiasis, BPH, diverticulitis s/p robotic sigmoidectomy, CKD, OSA, GERD, HLD, cholelithiasis, anxiety, who presents to the emergency department for chest pain, shortness of breath, upper abdominal pain.  Patient had onset of chest pain radiating to neck on left and back yesterday as well us  to upper abdomen. Pain in chest and neck was more severe than abdomen. Also complained of SOB. Had emesis x 1. No diarrhea. Orthopnea. Cardiac workup negative.  US  shows cholelithiasis without any signs of cholecystitis. CBD 8mm. WBC normal. LFTs normal. Patient transferred to [facility] from [facility] due to concern for possible dissection and patient has contrast allergy of hives--felt safer to have prep for CT scan at [facility] in case of dissection so could more urgently be intervened on if needed. General surgery consulted for cholelithiasis and abdominal pain.   CTA overall unremarkable, cholelithiasis, diverticulosis without diverticulitis, prostatomegaly (stable), thyroid  goiter, mild cardiomegaly with trace effusion.  Patient states he has had pain like this in the past when he had diverticulitis but this is first episode since his surgery at the end of 2024. Also similar pain with nephrolithiasis in the past.  From OSH chart review, appears patient has had history of globus sensation, laryngeal spasm, esophageal spasm. Also has had concern for stroke in the past but workup with multiple imaging studies including CT and MR were negative. Was told he had thalamic stroke given symptoms most likely but given negative imaging, no intervention or blood thinners. CTA for dissection 3 years ago was negative and no evidence of aneurysm at that time.  It does seem that patient has had extensive workup for complaints of chest pain and abdominal pain in the past.  He has had  gastric emptying study which was normal in 2019.  He has had multiple CT scans and x-rays to workup abdominal pain, all unremarkable.  He has had right upper quadrant ultrasound previously that showed cholelithiasis without any other concerning findings.  He has had multiple stress tests which have been normal.  I am unable to review colonoscopy reports from outside hospital for gastroenterology notes refer to report as showing 1 small polyp with pathology consistent with tubular adenoma..  Biopsy from previous EGD showed gastritis, again I am not able to directly see EGD report  10 point review of systems is negative except as listed above in HPI.  Objective  Past Medical History: Past Medical History:  Diagnosis Date   Anxiety 05/12/2021   BPH (benign prostatic hyperplasia) 04/15/2024   Chronic kidney disease 04/15/2021   Depression 05/12/2021   GERD (gastroesophageal reflux disease) 08/01/2023   in [place]   History of kidney stones 2017   Pre-diabetes 04/15/2021   no meds   Prostate CA (HCC)    enlarged   Sensorineural hearing loss (SNHL), bilateral 2024   Sleep apnea 03/2015   occas use of CPAP    Past Surgical History: Past Surgical History:  Procedure Laterality Date   COLONOSCOPY  12/2016   RADIOLOGY WITH ANESTHESIA N/A 04/15/2021   Procedure: MRI WITH ANESTHESIA OF BRAIN W/O CONSTRAST;  Surgeon: Radiologist, Medication, MD;  Location: [facility] OR;  Service: Radiology;  Laterality: N/A;   RESECTION COLON SIGMOID ROBOTIC XI     08/29/23   UPPER GASTROINTESTINAL ENDOSCOPY  10/2017   UPPER GASTROINTESTINAL ENDOSCOPY  01/2015    Family History:  History reviewed. No pertinent family  history.  Social History:  reports that he has never smoked. He has never used smokeless tobacco. He reports that he does not currently use alcohol. He reports that he does not use drugs.  Allergies:  Allergies  Allergen Reactions   Hydromorphone Itching   Iodinated Contrast Media Itching     Medications: I have reviewed the patient's current medications.  Labs: I have personally reviewed all labs for the past 24h  Imaging: I have personally reviewed and interpreted all imaging for the past 24h and agree with the radiologist's impression.  US  Abdomen Limited RUQ (LIVER/GB) Result Date: 08/15/2024 CLINICAL DATA:  Abdominal pain. EXAM: ULTRASOUND ABDOMEN LIMITED RIGHT UPPER QUADRANT COMPARISON:  CT abdomen pelvis dated 08/01/2023. FINDINGS: Gallbladder: The gallbladder is filled with stone and contracted. There is mild gallbladder wall thickening measuring 4-5 mm in thickness and may be partly related to underdistention. No pericholecystic fluid. Negative sonographic Murphy's sign. Common bile duct: Diameter: 8 mm Liver: The liver is unremarkable. Portal vein is patent on color Doppler imaging with normal direction of blood flow towards the liver. Other: None. IMPRESSION: Cholelithiasis without sonographic evidence of acute cholecystitis. A hepatobiliary scintigraphy may provide better evaluation of the gallbladder if there is a high clinical concern for acute cholecystitis . Electronically Signed   By: Vanetta Chou M.D.   On: 08/15/2024 17:46   DG Chest 2 View Result Date: 08/15/2024 EXAM: 2 VIEW(S) XRAY OF THE CHEST 08/15/2024 03:18:00 PM COMPARISON: 03/31/21 CLINICAL HISTORY: chest FINDINGS: LUNGS AND PLEURA: No focal pulmonary opacity. No pulmonary edema. No pleural effusion. No pneumothorax. HEART AND MEDIASTINUM: No acute abnormality of the cardiac and mediastinal silhouettes. BONES AND SOFT TISSUES: No acute osseous abnormality. IMPRESSION: 1. No acute cardiopulmonary process identified. Electronically signed by: Waddell Calk MD 08/15/2024 04:08 PM EST RP Workstation: [id number]     Physical Exam Blood pressure 135/78, pulse 83, temperature 98.1 F (36.7 C), temperature source Oral, resp. rate 19, SpO2 99%. General: no acute distress HEENT: normocephalic,  atraumatic Oropharynx: mucous membranes moist CV: Regular rate and rhythm, normotensive Chest: equal chest rise bilaterally normal respiratory effort on room air Abdomen: soft, nondistended, and tender to palpation in LUQ and epigastrium, no RUQ tenderness or Murphy's sign Extremities: moves all extremities Skin: warm, dry, no rashes Psych: normal memory, normal mood/affect  Neuro: No focal neurologic deficits, A&Ox3    Assessment   Osias Resnick is an 57 y.o. male with chest, left neck, and abdominal pain with cholelithiasis. Consulted to evaluate for gallbladder source of pain/biliary colic.  Plan  - Recommend HIDA - NPO in anticipation for above - Pending results will determine if cholecystectomy indicated. His pain is primarily on left upper quadrant and epigastrium. No RUQ pain and states never has RUQ pain when he has any episodes of abdominal pain - DVT - SCDs, ok for DVT ppx from surgical perspective  I reviewed ED provider notes, hospitalist notes, last 24 h vitals and pain scores, last 48 h intake and output, last 24 h labs and trends, last 24 h imaging results, and outside provider notes including from GI, Urology, General Surgery, ENT, Neurology and Family Medicine--all specialties with [facility]..  This care required high  level of medical decision making.   I spent a total of 81 minutes in both face-to-face and non-face-to-face activities, excluding procedures performed, for this visit on the date of this encounter. I personally reviewed all labs and imaging. I discussed plan with patient   Orie Silversmith, MD General Surgery, Surgical  Critical Care and Trauma

## 2024-08-15 NOTE — ED Notes (Signed)
 Called Samuel Patrick at [facility] for transport

## 2024-08-15 NOTE — ED Notes (Signed)
 Called CT to ask about PT pickup.

## 2024-08-15 NOTE — ED Notes (Signed)
 Called and placed PT on monitor with CCMD

## 2024-08-15 NOTE — ED Provider Notes (Signed)
 [facility] AT [facility] Provider Note   CSN: [id number] Arrival date & time: 08/15/24  1450     Patient presents with: Chest Pain   Samuel Patrick is a 58 y.o. male with past medical history of kidney stones, prostate cancer, BPH, resection of colon, CKD stage II, OSA, GERD, gallstones, HLD, diverticulosis presents Emergency Department for evaluation of chest pain, shortness of breath, upper abdominal pain.   Reports that chest pain started last night and radiates into left neck.  He endorses associated shortness of breath that worsens with laying down but occurs at rest. No pedal edema. Had 1 episode of vomiting last night at 1130 when he was walking to the bathroom. Since then has had persistent nausea. Reports chest pain is worse than upper abdominal pain. CP described as sharp pressure and 8/10 in intensity. No radiation into back. No paresthesia to extremities. Recently had nonproductive cough, congestion 2 days ago which has resolved.   {Add pertinent medical, surgical, social history, OB history to HPI:32947}  Chest Pain      Prior to Admission medications   Medication Sig Start Date End Date Taking? Authorizing Provider  amoxicillin -clavulanate (AUGMENTIN ) 875-125 MG tablet Take 1 tablet by mouth every 8 (eight) hours. 07/12/23   Horton, Roxie M, DO  ciprofloxacin  (CIPRO ) 500 MG tablet Take 500 mg by mouth 2 (two) times daily.    [provider]  ibuprofen  (ADVIL ) 800 MG tablet Take 1 tablet (800 mg total) by mouth every 8 (eight) hours as needed for moderate pain. 05/21/23   Rancour, Garnette, MD  ondansetron  (ZOFRAN -ODT) 4 MG disintegrating tablet Take 1 tablet (4 mg total) by mouth every 8 (eight) hours as needed for nausea or vomiting. 05/21/23   Rancour, Garnette, MD  oxyCODONE  (ROXICODONE ) 5 MG immediate release tablet Take 1 tablet (5 mg total) by mouth every 4 (four) hours as needed for severe pain (pain score 7-10). 08/02/23    Robinson, John K, PA-C  tamsulosin  (FLOMAX ) 0.4 MG CAPS capsule Take 0.4 mg by mouth daily. 03/11/21   [provider]  zolpidem  (AMBIEN ) 10 MG tablet Take 10 mg by mouth at bedtime. 04/17/18   [provider]    Allergies: Hydromorphone and Iodinated contrast media    Review of Systems  Cardiovascular:  Positive for chest pain.    Updated Vital Signs BP (!) 143/83   Pulse 81   Temp 98.1 F (36.7 C) (Oral)   Resp 12   SpO2 100%   Physical Exam Vitals and nursing note reviewed.  Constitutional:      General: He is not in acute distress.    Appearance: Normal appearance.  HENT:     Head: Normocephalic and atraumatic.  Eyes:     Conjunctiva/sclera: Conjunctivae normal.  Cardiovascular:     Rate and Rhythm: Tachycardia present.     Heart sounds: Normal heart sounds. No murmur heard.    Comments: BP L 137/84 R 126/70.  Persistent blood pressure in 120s-130s systolic Initially tachycardic at 120 bpm which resolved following IVF to 83 bpm Pulmonary:     Effort: Pulmonary effort is normal. No respiratory distress.  Abdominal:     Tenderness: There is abdominal tenderness in the right upper quadrant and epigastric area.  Musculoskeletal:     Right lower leg: No edema.     Left lower leg: No edema.  Skin:    Coloration: Skin is not jaundiced or pale.  Neurological:     Mental Status: He  is alert. Mental status is at baseline.     Comments: Initially had no complaints of paresthesia to extremities.  On reassessment, reports that he has paresthesia to fingertips and toes of BUE and BLE     (all labs ordered are listed, but only abnormal results are displayed) Labs Reviewed  BASIC METABOLIC PANEL WITH GFR - Abnormal; Notable for the following components:      Result Value   Glucose, Bld 105 (*)    Creatinine, Ser 1.55 (*)    GFR, Estimated 52 (*)    All other components within normal limits  CBC - Abnormal; Notable for the following components:   MCV 76.7 (*)     MCH 23.8 (*)    RDW 15.8 (*)    All other components within normal limits  RESP PANEL BY RT-PCR (RSV, FLU A&B, COVID)  RVPGX2  HEPATIC FUNCTION PANEL  LIPASE, BLOOD  PRO BRAIN NATRIURETIC PEPTIDE  PRO BRAIN NATRIURETIC PEPTIDE  TROPONIN T, HIGH SENSITIVITY  TROPONIN T, HIGH SENSITIVITY    EKG: EKG Interpretation Date/Time:  Thursday August 15 2024 14:57:21 EST Ventricular Rate:  119 PR Interval:  164 QRS Duration:  78 QT Interval:  308 QTC Calculation: 433 R Axis:   92  Text Interpretation: Sinus tachycardia Rightward axis T wave abnormality, consider inferior ischemia Abnormal ECG When compared with ECG of 14-Apr-2021 00:17,T wave inversions inferior leads noted, nonspecific Confirmed by Cottie Cough [phone]) on 08/15/2024 3:20:10 PM  Radiology: US  Abdomen Limited RUQ (LIVER/GB) Result Date: 08/15/2024 CLINICAL DATA:  Abdominal pain. EXAM: ULTRASOUND ABDOMEN LIMITED RIGHT UPPER QUADRANT COMPARISON:  CT abdomen pelvis dated 08/01/2023. FINDINGS: Gallbladder: The gallbladder is filled with stone and contracted. There is mild gallbladder wall thickening measuring 4-5 mm in thickness and may be partly related to underdistention. No pericholecystic fluid. Negative sonographic Murphy's sign. Common bile duct: Diameter: 8 mm Liver: The liver is unremarkable. Portal vein is patent on color Doppler imaging with normal direction of blood flow towards the liver. Other: None. IMPRESSION: Cholelithiasis without sonographic evidence of acute cholecystitis. A hepatobiliary scintigraphy may provide better evaluation of the gallbladder if there is a high clinical concern for acute cholecystitis . Electronically Signed   By: Vanetta Chou M.D.   On: 08/15/2024 17:46   DG Chest 2 View Result Date: 08/15/2024 EXAM: 2 VIEW(S) XRAY OF THE CHEST 08/15/2024 03:18:00 PM COMPARISON: 03/31/21 CLINICAL HISTORY: chest FINDINGS: LUNGS AND PLEURA: No focal pulmonary opacity. No pulmonary edema. No pleural  effusion. No pneumothorax. HEART AND MEDIASTINUM: No acute abnormality of the cardiac and mediastinal silhouettes. BONES AND SOFT TISSUES: No acute osseous abnormality. IMPRESSION: 1. No acute cardiopulmonary process identified. Electronically signed by: Waddell Calk MD 08/15/2024 04:08 PM EST RP Workstation: [id number]     Medications Ordered in the ED  diphenhydrAMINE  (BENADRYL ) capsule 50 mg (has no administration in time range)    Or  diphenhydrAMINE  (BENADRYL ) injection 50 mg (has no administration in time range)  aspirin chewable tablet 324 mg (324 mg Oral Given 08/15/24 1538)  morphine  (PF) 4 MG/ML injection 4 mg (4 mg Intravenous Given 08/15/24 1537)  sodium chloride  0.9 % bolus 1,000 mL (0 mLs Intravenous Stopped 08/15/24 1644)  ondansetron  (ZOFRAN ) injection 4 mg (4 mg Intravenous Given 08/15/24 1537)  morphine  (PF) 4 MG/ML injection 4 mg (4 mg Intravenous Given 08/15/24 1624)  methylPREDNISolone sodium succinate (SOLU-MEDROL) 40 mg/mL injection 40 mg (40 mg Intravenous Given 08/15/24 1753)  fentaNYL  (SUBLIMAZE ) injection 50 mcg (50 mcg Intravenous Given 08/15/24 1844)  Clinical Course as of 08/15/24 1904  Thu Aug 15, 2024  1532 58 yo male w/ obesity here with abdominal discomfort, vomiting, chest pain, SOB.  Onset last night with nausea and vomiting.  This morning developed sharp epigastric pain that radiates up to left side of neck.  Worse with lying down.  Nauseated. Feeling SOB as well.  Denies coronary history of hx of PE/DVT.  Reports only abdominal surgical hx of colonic resection.  ON exam he appears uncomfortable, tachycardia, not hypoxic, lungs CTAB, no audible murmurs, equal pulses and BP in bilateral arms, +epigastric and RUQ/murphy sign on exam.  Pending GB ultrasound, labs, troponin, xray, IV fluids, IV morphine  and zofran .  ECG shows sinus tachycardia with nonspecific T wave inversions in inferior leads that may have been present on prior tracing (it is subtle), but no evident  MI. [MT]  1534 I suspect this may be referred abdominal pain or even biliary colic with his GI symptoms but we'll follow up troponin levels.  He does not have hx of aortic aneurysm or smoking hx (CT dissection study 3 days ago without aneurysm), and my suspicion for aortic dissection is low.  Likewise low suspicion for acute PE, no hypoxia.  Will follow up on labs [MT]  1751 Patient having persistent chest pain requiring more morphine .  BP and HR has remained wnl, which would be unusual for an acute aortic dissection or SCAD, but he's describing now tingling in his fingertips.  We will move to pre-medicating for iodine CT dissection study.  He has a history of reported hives with iodine in the past, but I do not have a reasonable safe alternative to offer in this freestanding ED.  Limited ultrasound capabilities and no MRI [MT]  1752 Gallstones noted on GB ultrasound - LFT's unremarkable, lipase wnl [MT]  1813 Dr Prentice Medicus EDP accepting physician at [facility] Ed - will plan for priority stat transfer for CT angiogram (should be appropriately close to pre-medication window upon his arrival); and general surgery consult for possible biliary colic. [MT]    Clinical Course User Index [MT] Trifan, Donnice PARAS, MD   {Click here for ABCD2, HEART and other calculators REFRESH Note before signing:1}                              Medical Decision Making Amount and/or Complexity of Data Reviewed Labs: ordered. Radiology: ordered.  Risk OTC drugs. Prescription drug management.   Patient presents to the ED for concern of upper abd pain, cp, shob, NV, this involves an extensive number of treatment options, and is a complaint that carries with it a high risk of complications and morbidity.  The differential diagnosis includes ACS, PNA, dissection,    Co morbidities that complicate the patient evaluation  See HPI Contrast allergy   Additional history obtained:  Additional history obtained from Nursing    External records from outside source obtained and reviewed including triage RN note   Lab Tests:  I Ordered, and personally interpreted labs.  The pertinent results include:   Troponin negative x 2 Respiratory panel negative BNP WNL No elevated LFTs Lipase WNL CBG 105 Creatinine 1.55 No leukocytosis   Imaging Studies ordered:  I ordered imaging studies including RUQ ultrasound, chest x-ray, CTA chest abdomen pelvis dissection started I independently visualized and interpreted imaging which showed  RUQ ultrasound: Gallstones without evidence of acute cholecystitis Chest x-ray: No cardiopulmonary abnormality I agree with the radiologist  interpretation CTA pending and will be performed at [facility] ED   Cardiac Monitoring:  The patient was maintained on a cardiac monitor.  I personally viewed and interpreted the cardiac monitored which showed an underlying rhythm of: Sinus tachycardia at 120 bpm with inverted T waves in inferior leads   Medicines ordered and prescription drug management:  I ordered medication including ASA, morphine  x 2, NS, fentanyl  for pain, hydration Reevaluation of the patient after these medicines showed that the patient improved I have reviewed the patients home medicines and have made adjustments as needed    Consultations Obtained:  I requested consultation with general surgery Dr. Ann,  and discussed lab and imaging findings as well as pertinent plan - they recommend:  Consult following obtaining CT dissection study and they can see patient tonight/tomorrow depending on results   Problem List / ED Course:  Upper abdominal pain NV Biliary colic Cholelithiasis Appears extremely uncomfortable secondary to pain on exam.  Is initially tachycardic at 120 bpm.  Blood pressure 144/89 Tenderness to upper abdomen most notably in epigastric and RUQ abdomen Tachycardia improved following NS Did provide morphine  for pain with absolutely no improvement.   Provided another dose of morphine  with mild improvement.  Switched to fentanyl  for better pain control Reports improvement of nausea from Zofran .  No vomiting while in ED  Chest pain Dyspnea Troponin negative x 2 EKG notable for inverted T waves in inferior leads which may be similar to previous.  Had flat 2 possibly inverted in previous EKG from 2022 Chest x-ray without abnormalities Initially, denied paresthesia to extremities.  No significant hypertension.  Blood pressures were not significantly different from BUE.  However, while in ED, did endorse paresthesia to fingertips to BUE and toes of BLE.  With continued complaints of chest pain despite multiple narcotic pain medicine, new paresthesia to extremities, think patient would benefit from dissection study to rule out dissection as etiology.  Unfortunately, patient has contrast allergy.  Did proceed with contrast allergy prophylaxis.  We provided steroid here at [facility] ED and patient will have Benadryl  1 hour prior to CT imaging.  As we have concern for dissection and patient has to go through prolonged contrast allergy protocol, think patient would benefit from transfer to [facility] emergency department to ensure patient is an adequate area if he does have dissection, general surgery consult for possible biliary colic causing and/or contributing to intractable pain.  Dr. Ula accepting physician at [facility]    Reevaluation:  After the interventions noted above, I reevaluated the patient and found that they have :stayed the same    Dispostion:  After consideration of the diagnostic results and the patients response to treatment, I feel that the patent would benefit from transfer to [facility] ED for CTA dissection study with contrast allergy protocol, general surgery follow-up.   Discussed ED workup, dispo with patient expressed understanding agrees with plan at this time.  All questions answered to her satisfaction  Patient staffed with attending  Dr. Cottie who individually assessed patient, reviewed ED workup and agrees with plan  Final diagnoses:  Upper abdominal pain  Nausea and vomiting, unspecified vomiting type  Chest pain, unspecified type  Dyspnea, unspecified type  Biliary colic  Calculus of gallbladder without cholecystitis without obstruction    ED Discharge Orders     None

## 2024-08-15 NOTE — ED Triage Notes (Signed)
 Chest pressure goes into neck Some sob Worse when laying down Started today

## 2024-08-15 NOTE — ED Provider Notes (Signed)
 Clinical Course as of 08/15/24 1857  Thu Aug 15, 2024  1532 57 yo male w/ obesity here with abdominal discomfort, vomiting, chest pain, SOB.  Onset last night with nausea and vomiting.  This morning developed sharp epigastric pain that radiates up to left side of neck.  Worse with lying down.  Nauseated. Feeling SOB as well.  Denies coronary history of hx of PE/DVT.  Reports only abdominal surgical hx of colonic resection.  ON exam he appears uncomfortable, tachycardia, not hypoxic, lungs CTAB, no audible murmurs, equal pulses and BP in bilateral arms, +epigastric and RUQ/murphy sign on exam.  Pending GB ultrasound, labs, troponin, xray, IV fluids, IV morphine  and zofran .  ECG shows sinus tachycardia with nonspecific T wave inversions in inferior leads that may have been present on prior tracing (it is subtle), but no evident MI. [MT]  1534 I suspect this may be referred abdominal pain or even biliary colic with his GI symptoms but we'll follow up troponin levels.  He does not have hx of aortic aneurysm or smoking hx (CT dissection study 3 days ago without aneurysm), and my suspicion for aortic dissection is low.  Likewise low suspicion for acute PE, no hypoxia.  Will follow up on labs [MT]  1751 Patient having persistent chest pain requiring more morphine .  BP and HR has remained wnl, which would be unusual for an acute aortic dissection or SCAD, but he's describing now tingling in his fingertips.  We will move to pre-medicating for iodine CT dissection study.  He has a history of reported hives with iodine in the past, but I do not have a reasonable safe alternative to offer in this freestanding ED.  Limited ultrasound capabilities and no MRI [MT]  1752 Gallstones noted on GB ultrasound - LFT's unremarkable, lipase wnl [MT]  1813 Dr Prentice Medicus EDP accepting physician at [facility] Ed - will plan for priority stat transfer for CT angiogram (should be appropriately close to pre-medication window upon his arrival);  and general surgery consult for possible biliary colic. [MT]    Clinical Course User Index [MT] Everli Rother, Donnice PARAS, MD      Cottie Donnice PARAS, MD 08/15/24 [phone]

## 2024-08-15 NOTE — ED Notes (Signed)
 Patient transported to CT

## 2024-08-16 ENCOUNTER — Inpatient Hospital Stay (HOSPITAL_COMMUNITY)

## 2024-08-16 DIAGNOSIS — N401 Enlarged prostate with lower urinary tract symptoms: Secondary | ICD-10-CM | POA: Diagnosis not present

## 2024-08-16 DIAGNOSIS — K8050 Calculus of bile duct without cholangitis or cholecystitis without obstruction: Secondary | ICD-10-CM | POA: Diagnosis not present

## 2024-08-16 LAB — LIPASE, BLOOD: Lipase: 28 U/L (ref 11–51)

## 2024-08-16 LAB — CBC
HCT: 44.6 % (ref 39.0–52.0)
Hemoglobin: 13.6 g/dL (ref 13.0–17.0)
MCH: 23.5 pg — ABNORMAL LOW (ref 26.0–34.0)
MCHC: 30.5 g/dL (ref 30.0–36.0)
MCV: 77 fL — ABNORMAL LOW (ref 80.0–100.0)
Platelets: 234 K/uL (ref 150–400)
RBC: 5.79 MIL/uL (ref 4.22–5.81)
RDW: 15.8 % — ABNORMAL HIGH (ref 11.5–15.5)
WBC: 5.5 K/uL (ref 4.0–10.5)
nRBC: 0 % (ref 0.0–0.2)

## 2024-08-16 LAB — COMPREHENSIVE METABOLIC PANEL WITH GFR
ALT: 21 U/L (ref 0–44)
AST: 24 U/L (ref 15–41)
Albumin: 3.6 g/dL (ref 3.5–5.0)
Alkaline Phosphatase: 99 U/L (ref 38–126)
Anion gap: 10 (ref 5–15)
BUN: 13 mg/dL (ref 6–20)
CO2: 19 mmol/L — ABNORMAL LOW (ref 22–32)
Calcium: 9 mg/dL (ref 8.9–10.3)
Chloride: 108 mmol/L (ref 98–111)
Creatinine, Ser: 1.46 mg/dL — ABNORMAL HIGH (ref 0.61–1.24)
GFR, Estimated: 55 mL/min — ABNORMAL LOW (ref >60)
Glucose, Bld: 138 mg/dL — ABNORMAL HIGH (ref 70–99)
Potassium: 4.8 mmol/L (ref 3.5–5.1)
Sodium: 137 mmol/L (ref 135–145)
Total Bilirubin: 0.6 mg/dL (ref 0.0–1.2)
Total Protein: 7.4 g/dL (ref 6.5–8.1)

## 2024-08-16 LAB — CBG MONITORING, ED: Glucose-Capillary: 133 mg/dL — ABNORMAL HIGH (ref 70–99)

## 2024-08-16 LAB — HIV ANTIBODY (ROUTINE TESTING W REFLEX): HIV Screen 4th Generation wRfx: NONREACTIVE

## 2024-08-16 MED ORDER — LACTATED RINGERS IV SOLN
INTRAVENOUS | Status: AC
Start: 2024-08-16 — End: 2024-08-16

## 2024-08-16 MED ORDER — TAMSULOSIN HCL 0.4 MG PO CAPS
0.4000 mg | ORAL_CAPSULE | Freq: Two times a day (BID) | ORAL | Status: DC
  Administered 2024-08-16 – 2024-08-19 (×6): 0.4 mg via ORAL
  Filled 2024-08-16 (×6): qty 1

## 2024-08-16 MED ORDER — HYDROMORPHONE HCL 1 MG/ML IJ SOLN
0.5000 mg | INTRAMUSCULAR | Status: DC | PRN
  Administered 2024-08-16 – 2024-08-19 (×11): 0.5 mg via INTRAVENOUS
  Filled 2024-08-16 (×12): qty 0.5

## 2024-08-16 MED ORDER — DIPHENHYDRAMINE HCL 25 MG PO CAPS: 25.0000 mg | ORAL_CAPSULE | Freq: Three times a day (TID) | ORAL | Status: DC | PRN

## 2024-08-16 MED ORDER — TECHNETIUM TC 99M MEBROFENIN IV KIT
5.0000 | PACK | Freq: Once | INTRAVENOUS | Status: AC | PRN
  Administered 2024-08-16: 5 via INTRAVENOUS

## 2024-08-16 MED ORDER — HYDROMORPHONE HCL 1 MG/ML IJ SOLN
0.5000 mg | INTRAMUSCULAR | Status: DC | PRN
  Administered 2024-08-16 (×2): 0.5 mg via INTRAVENOUS
  Filled 2024-08-16: qty 0.5
  Filled 2024-08-16: qty 1

## 2024-08-16 MED ORDER — MORPHINE SULFATE (PF) 4 MG/ML IV SOLN
3.0000 mg | Freq: Once | INTRAVENOUS | Status: AC
  Administered 2024-08-16: 3 mg via INTRAVENOUS
  Filled 2024-08-16: qty 1

## 2024-08-16 MED ORDER — ORAL CARE MOUTH RINSE: 15.0000 mL | OROMUCOSAL | Status: DC | PRN

## 2024-08-16 MED ORDER — DIPHENHYDRAMINE HCL 50 MG/ML IJ SOLN
50.0000 mg | Freq: Once | INTRAMUSCULAR | Status: AC
  Administered 2024-08-16: 50 mg via INTRAVENOUS
  Filled 2024-08-16: qty 1

## 2024-08-16 MED ORDER — ZOLPIDEM TARTRATE 5 MG PO TABS
10.0000 mg | ORAL_TABLET | Freq: Every evening | ORAL | Status: DC | PRN
  Administered 2024-08-16 – 2024-08-18 (×3): 10 mg via ORAL
  Filled 2024-08-16 (×3): qty 2

## 2024-08-16 MED ORDER — DIPHENHYDRAMINE HCL 50 MG/ML IJ SOLN
25.0000 mg | Freq: Four times a day (QID) | INTRAMUSCULAR | Status: AC | PRN
Start: 2024-08-16 — End: ?
  Administered 2024-08-16 – 2024-08-19 (×9): 25 mg via INTRAVENOUS
  Filled 2024-08-16 (×10): qty 1

## 2024-08-16 MED ORDER — KETOROLAC TROMETHAMINE 30 MG/ML IJ SOLN
30.0000 mg | Freq: Four times a day (QID) | INTRAMUSCULAR | Status: DC | PRN
  Administered 2024-08-16 (×2): 30 mg via INTRAVENOUS
  Filled 2024-08-16 (×2): qty 1

## 2024-08-16 MED ORDER — CAMPHOR-MENTHOL 0.5-0.5 % EX LOTN
TOPICAL_LOTION | CUTANEOUS | Status: DC | PRN
  Filled 2024-08-16: qty 222

## 2024-08-16 NOTE — ED Notes (Signed)
 PT and brother stated that physician stated that he could eat. PT given graham crackers and a shasta ginger ale

## 2024-08-16 NOTE — Plan of Care (Signed)
   Problem: Education: Goal: Knowledge of General Education information will improve Description Including pain rating scale, medication(s)/side effects and non-pharmacologic comfort measures Outcome: Progressing

## 2024-08-16 NOTE — Progress Notes (Signed)
 [facility]   ROUNDING   NOTE Samuel Patrick FMW:[id number]  DOB: [birth year]  DOA: 08/15/2024  PCP: Trude Bernarda BROCKS, MD  08/16/2024,6:15 AM  LOS: 1 day    Code Status: Full code     from: Home   58 year old black male Previous robotic sigmoid colectomy for complicated diverticulitis 1119-->09/02/2023 admission at [facility] Known thyroid  nodule PAD HLD BPH CKD stage II  Apparent extensive workup gastric emptying study that was normal in 2019 with previous workup for right upper quadrant discomfort with cholelithiasis without specific concerning findings  Admit 11/7 AM because of chest abdomen discomfort etc. from [facility] with concern for acute cholecystitis in the setting of gallbladder pain/colic   Pertinent imaging/studies till date  11/6 CT angio = no evidence thoracoabdominal aneurysm pulmonary emboli mild cardiomegaly cholelithiasis without cholecystitis stable thyroid  goiter 11/6 US  abdomen shows cholelithiasis without acute cholecystitis   Assessment  & Plan :    Abdominal pain not otherwise specified--concern for acalculous cholecystitis? Underlying history of globus laryngeal spasm esophageal spasm noted in his [facility] [facility] regional chart    [-] GES 2019 He cannot remember who his gastroenterologist is and only saw them once--- my DDx for him is probably severe reflux and if his HIDA is normal I would add PPI Protonix  40 twice daily, Carafate 1 g 4 times daily and Pepcid  CTA does not point to any life-threatening illness-this is not a PE Ultrasound abdomen pelvis only shows cholelithiasis without blockage HIDA scan is pending-pain control has to be Toradol  30 every 6 as needed for now instead of morphine  or fentanyl  to allow for the HIDA scan Previous robotic sigmoid colectomy 2024 [place] No issues per se PAD HLD Not currently on any meds for the same BPH History elevated PSA Continue Flomax  0.4\ To follow  closely with outpatient physician CKD 3 Trends to be monitored with labs  Data Reviewed today:  Sodium 137 potassium 4.8 BUN/creatinine 13/1.4 WBC 5.5 hemoglobin 13.6 platelet 234   DVT prophylaxis: SCD at this time  Status is: Inpatient Inpatient pending resolution/determination of next steps  Dispo/Global plan: Inpatient   Time 45   Subjective:   Awake pleasant coherent no distress but seems to be in some discomfort He is needing Toradol  because of the spasm and pain he describes the pain as being for starting in the mid chest and now radiating to the left upper quadrant-he does not have right upper quadrant discomfort or pain He has no fever no chills no nausea no vomiting-he had a good stool yesterday-he did have yesterday some nausea     Objective + exam Vitals:   08/16/24 0230 08/16/24 0340 08/16/24 0348 08/16/24 0438  BP: 124/74 116/71  120/76  Pulse: 90 78  75  Resp: 17 14  16   Temp:   98.1 F (36.7 C) 97.7 F (36.5 C)  TempSrc:   Oral Oral  SpO2: 98% 99%  100%  Weight:      Height:       Filed Weights   08/15/24 2335  Weight: 106 kg     Examination: EOMI NCAT pleasant in some distress No icterus no pallor Neck is thick soft supple S1-S2 no murmur ROM is intact without any focal deficit Abdomen is soft without rebound although he is tender in the epigastrium and slightly distended     Scheduled Meds:  tamsulosin   0.4 mg Oral BID   Continuous Infusions:  lactated ringers  75 mL/hr at 08/16/24  0209   diphenhydrAMINE , HYDROmorphone (DILAUDID) injection, zolpidem   Jai-Gurmukh Etsuko Dierolf, MD  [facility]

## 2024-08-16 NOTE — H&P (Signed)
 History and Physical    [name]:[id number] DOB: [birth year] DOA: 08/15/2024  PCP: Trude Bernarda BROCKS, MD   Patient coming from: Home >> corroborate  I have personally briefly reviewed patient's old medical records in [facility]  Chief Complaint: Chest and abdominal pain  HPI: Samuel Patrick is a 58 y.o. male with medical history significant for BPH. Patient presented to the ED with complaints of central chest pressure radiating to the back, congestion, mid abdominal pain that started yesterday.  Reports pain radiating up to his neck.  1 episode of vomiting.  No diarrhea.  No urinary symptoms.  Difficulty breathing is worse with lying down.  No lower extremity swelling.  No abdominal bloating.  ED Course: Patient initially presented at [facility].  Temperature 98.  Heart rate 81-120.  Respiratory rate 10-24.  Blood pressure systolic 120s to 839d.  O2 sats greater than 97% on room air. Troponin less than 15 x 2. Two-view chest x-ray negative for acute abnormality. Right upper quadrant ultrasound-cholelithiasis without acute cholecystitis.  There was a concern for dissection due to patient's chest pain, but with contrast allergy, patient was premedicated with Benadryl  and Solu-Medrol, and transferred to [facility], ED for CTA chest abdomen and pelvis. -Which was negative for acute abnormality, again showed cholelithiasis without cholecystitis.  General surgery was consulted, recommended hospitalist admission.  Review of Systems: As per HPI all other systems reviewed and negative.  Past Medical History:  Diagnosis Date   Anxiety 05/12/2021   BPH (benign prostatic hyperplasia) 04/15/2024   Chronic kidney disease 04/15/2021   Depression 05/12/2021   GERD (gastroesophageal reflux disease) 08/01/2023   in [place]   History of kidney stones 2017   Pre-diabetes 04/15/2021   no meds   Prostate CA (HCC)    enlarged   Sensorineural hearing loss (SNHL), bilateral 2024    Sleep apnea 03/2015   occas use of CPAP    Past Surgical History:  Procedure Laterality Date   COLONOSCOPY  12/2016   RADIOLOGY WITH ANESTHESIA N/A 04/15/2021   Procedure: MRI WITH ANESTHESIA OF BRAIN W/O CONSTRAST;  Surgeon: Radiologist, Medication, MD;  Location: [facility] OR;  Service: Radiology;  Laterality: N/A;   RESECTION COLON SIGMOID ROBOTIC XI     08/29/23   UPPER GASTROINTESTINAL ENDOSCOPY  10/2017   UPPER GASTROINTESTINAL ENDOSCOPY  01/2015     reports that he has never smoked. He has never used smokeless tobacco. He reports that he does not currently use alcohol. He reports that he does not use drugs.  Allergies  Allergen Reactions   Hydromorphone Itching   Iodinated Contrast Media Itching    Gadolinium    Triptans Other (See Comments)    Migraines with vascular features - should not receive triptans in the future   Family history of hypertension.  Prior to Admission medications   Medication Sig Start Date End Date Taking? Authorizing Provider  sildenafil (VIAGRA) 100 MG tablet Take 100 mg by mouth as needed for erectile dysfunction.   Yes [provider]  tamsulosin  (FLOMAX ) 0.4 MG CAPS capsule Take 0.4 mg by mouth in the morning and at bedtime. 03/11/21  Yes [provider]  zolpidem  (AMBIEN ) 10 MG tablet Take 10 mg by mouth at bedtime. 04/17/18  Yes [provider]    Physical Exam: Vitals:   08/15/24 1900 08/15/24 2230 08/15/24 2314 08/15/24 2335  BP: 135/78 (!) 165/70    Pulse: 83 (!) 108    Resp: 19 13    Temp:   (!)  97.5 F (36.4 C)   TempSrc:   Oral   SpO2: 99% 100%    Weight:    106 kg  Height:    5' 7 (1.702 m)    Constitutional: NAD, calm, comfortable Vitals:   08/15/24 1900 08/15/24 2230 08/15/24 2314 08/15/24 2335  BP: 135/78 (!) 165/70    Pulse: 83 (!) 108    Resp: 19 13    Temp:   (!) 97.5 F (36.4 C)   TempSrc:   Oral   SpO2: 99% 100%    Weight:    106 kg  Height:    5' 7 (1.702 m)   Eyes: PERRL, lids and  conjunctivae normal ENMT: Mucous membranes are moist.  Neck: normal, supple, no masses, no thyromegaly Respiratory: clear to auscultation bilaterally, no wheezing, no crackles. Normal respiratory effort. No accessory muscle use.  Cardiovascular: Regular rate and rhythm, no murmurs / rubs / gallops. No extremity edema.  Extremities warm.   Abdomen: Mild right upper quadrant and epigastric tenderness, no masses palpated. No hepatosplenomegaly.   Musculoskeletal: no clubbing / cyanosis. No joint deformity upper and lower extremities.  Skin: no rashes, lesions, ulcers. No induration Neurologic: No facial asymmetry, moving extremity spontaneously, speech fluent.  Psychiatric: Normal judgment and insight. Alert and oriented x 3. Normal mood.   Labs on Admission: I have personally reviewed following labs and imaging studies  CBC: Recent Labs  Lab 08/15/24 1457  WBC 4.3  HGB 13.4  HCT 43.1  MCV 76.7*  PLT 241   Basic Metabolic Panel: Recent Labs  Lab 08/15/24 1457  NA 141  K 4.3  CL 107  CO2 25  GLUCOSE 105*  BUN 14  CREATININE 1.55*  CALCIUM 9.8   GFR: Estimated Creatinine Clearance: 60.3 mL/min (A) (by C-G formula based on SCr of 1.55 mg/dL (H)). Liver Function Tests: Recent Labs  Lab 08/15/24 1458  AST 24  ALT 15  ALKPHOS 120  BILITOT 0.3  PROT 7.7  ALBUMIN 4.3   Recent Labs  Lab 08/15/24 1458  LIPASE 32    BNP (last 3 results) Recent Labs    08/15/24 1458  PROBNP <50.0   Radiological Exams on Admission: CT Angio Chest/Abd/Pel for Dissection W and/or Wo Contrast Result Date: 08/15/2024 CLINICAL DATA:  Chest pressure radiating to neck, shortness of breath EXAM: CT ANGIOGRAPHY CHEST, ABDOMEN AND PELVIS TECHNIQUE: Non-contrast CT of the chest was initially obtained. Multidetector CT imaging through the chest, abdomen and pelvis was performed using the standard protocol during bolus administration of intravenous contrast. Multiplanar reconstructed images and MIPs  were obtained and reviewed to evaluate the vascular anatomy. RADIATION DOSE REDUCTION: This exam was performed according to the departmental dose-optimization program which includes automated exposure control, adjustment of the mA and/or kV according to patient size and/or use of iterative reconstruction technique. CONTRAST:  75mL OMNIPAQUE  IOHEXOL  350 MG/ML SOLN COMPARISON:  12/01/2022 FINDINGS: CTA CHEST FINDINGS Cardiovascular: No evidence of thoracic aortic aneurysm or dissection. Mild cardiomegaly with trace pericardial effusion. Coronary artery atherosclerosis. There is technically adequate opacification of the pulmonary vasculature, with no filling defects or pulmonary emboli. Mediastinum/Nodes: Stable enlarged left lobe thyroid , with exophytic nodule off the lower pole left lobe measuring 3.5 x 2.3 cm unchanged. No pathologic adenopathy. Trachea and esophagus are unremarkable. Lungs/Pleura: Linear areas of consolidation at the lung bases consistent with atelectasis or scarring. No acute airspace disease, effusion, or pneumothorax. Central airways are patent. Musculoskeletal: No acute or destructive bony abnormalities. Reconstructed images demonstrate no additional  findings. Review of the MIP images confirms the above findings. CTA ABDOMEN AND PELVIS FINDINGS VASCULAR Aorta: Normal caliber aorta without aneurysm, dissection, vasculitis or significant stenosis. Mild atherosclerosis. Celiac: Patent without evidence of aneurysm, dissection, vasculitis or significant stenosis. SMA: Patent without evidence of aneurysm, dissection, vasculitis or significant stenosis. Renals: Both renal arteries are patent without evidence of aneurysm, dissection, vasculitis, fibromuscular dysplasia or significant stenosis. IMA: Patent without evidence of aneurysm, dissection, vasculitis or significant stenosis. Inflow: Patent without evidence of aneurysm, dissection, vasculitis or significant stenosis. Veins: No obvious venous  abnormality within the limitations of this arterial phase study. Review of the MIP images confirms the above findings. NON-VASCULAR Hepatobiliary: Multiple calcified gallstones without evidence of acute cholecystitis. Liver is unremarkable. No biliary duct dilation or choledocholithiasis. Pancreas: Unremarkable. No pancreatic ductal dilatation or surrounding inflammatory changes. Spleen: Normal in size without focal abnormality. Adrenals/Urinary Tract: Nonobstructing left renal calculi, largest in the upper pole measuring 6 mm. Punctate 2 mm nonobstructing calculus lower pole right kidney. No obstructive uropathy within either kidney. The adrenals and bladder are unremarkable. Stomach/Bowel: No bowel obstruction or ileus. Normal appendix right lower quadrant. Diverticulosis of the descending and sigmoid colon without evidence of acute diverticulitis. No bowel wall thickening or inflammatory change. Lymphatic: No pathologic adenopathy. Reproductive: Stable marked enlargement of the prostate. Other: No free fluid or free intraperitoneal gas. No abdominal wall hernia. Musculoskeletal: No acute or destructive bony abnormalities. Reconstructed images demonstrate no additional findings. Review of the MIP images confirms the above findings. IMPRESSION: Vascular: 1. No evidence of thoracoabdominal aortic aneurysm or dissection. 2. No evidence of pulmonary embolus. 3. Aortic Atherosclerosis (ICD10-I70.0). Coronary artery atherosclerosis. Nonvascular: 1. Mild cardiomegaly with trace pericardial effusion. 2. Cholelithiasis without cholecystitis. 3. Distal colonic diverticulosis without diverticulitis. 4. Stable marked prostatomegaly. 5. Stable thyroid  goiter, with exophytic nodule off the left lobe unchanged. Electronically Signed   By: Ozell Daring M.D.   On: 08/15/2024 22:47   US  Abdomen Limited RUQ (LIVER/GB) Result Date: 08/15/2024 CLINICAL DATA:  Abdominal pain. EXAM: ULTRASOUND ABDOMEN LIMITED RIGHT UPPER QUADRANT  COMPARISON:  CT abdomen pelvis dated 08/01/2023. FINDINGS: Gallbladder: The gallbladder is filled with stone and contracted. There is mild gallbladder wall thickening measuring 4-5 mm in thickness and may be partly related to underdistention. No pericholecystic fluid. Negative sonographic Murphy's sign. Common bile duct: Diameter: 8 mm Liver: The liver is unremarkable. Portal vein is patent on color Doppler imaging with normal direction of blood flow towards the liver. Other: None. IMPRESSION: Cholelithiasis without sonographic evidence of acute cholecystitis. A hepatobiliary scintigraphy may provide better evaluation of the gallbladder if there is a high clinical concern for acute cholecystitis . Electronically Signed   By: Vanetta Chou M.D.   On: 08/15/2024 17:46   DG Chest 2 View Result Date: 08/15/2024 EXAM: 2 VIEW(S) XRAY OF THE CHEST 08/15/2024 03:18:00 PM COMPARISON: 03/31/21 CLINICAL HISTORY: chest FINDINGS: LUNGS AND PLEURA: No focal pulmonary opacity. No pulmonary edema. No pleural effusion. No pneumothorax. HEART AND MEDIASTINUM: No acute abnormality of the cardiac and mediastinal silhouettes. BONES AND SOFT TISSUES: No acute osseous abnormality. IMPRESSION: 1. No acute cardiopulmonary process identified. Electronically signed by: Waddell Calk MD 08/15/2024 04:08 PM EST RP Workstation: [id number]   EKG: Independently reviewed.  Sinus tachycardia rate 119, QTc 433. No significant change from prior.  Assessment/Plan Principal Problem:   Biliary colic Active Problems:   Benign prostatic hyperplasia with lower urinary tract symptoms   Moderate major depression (HCC)   OSA (obstructive sleep apnea)  Assessment and Plan:  Cholelithiasis, biliary colic-presented with chest and abdominal pain, CT and RUQ ultrasound shows cholelithiasis, without acute cholecystitis.  No other etiology of pain identified.  WBC 4.3.  Afebrile.  Not hypoxic.  Troponin <15 x 2.  EKG without acute abnormality.   Normal liver enzymes.  Initially presented at [facility], was sent to [facility], required premedication with steroids and Benadryl  due to contrast allergy so CTA will be done. - General Surgery consulted, will see in consult - N.p.o. - 1 L bolus given, continue LR 75cc/hr x 20hrs - IV Dilaudid 0.5 every 4 hourly as needed - Hold off on antibiotics at this time  BPH - Resume tamsulosin   CKD stage II/3A-creatinine 1.5.  At baseline.  DVT prophylaxis: SCDS pending Gen surg eval Code Status: FULL Family Communication: Son and brother at bedside Disposition Plan: ~ 2 days Consults called: Gen Surg Admission status:  Inpt Tele I certify that at the point of admission it is my clinical judgment that the patient will require inpatient hospital care spanning beyond 2 midnights from the point of admission due to high intensity of service, high risk for further deterioration and high frequency of surveillance required.    Author: Tully FORBES Carwin, MD 08/16/2024 1:44 AM  For on call review [email or web address].

## 2024-08-17 ENCOUNTER — Encounter (HOSPITAL_COMMUNITY)
Admission: EM | Disposition: A | Discharge status: Home / Self Care | Payer: Self-pay | Source: Home / Self Care | Attending: Family Medicine

## 2024-08-17 ENCOUNTER — Inpatient Hospital Stay (HOSPITAL_COMMUNITY): Admitting: Certified Registered Nurse Anesthetist

## 2024-08-17 ENCOUNTER — Encounter (HOSPITAL_COMMUNITY): Payer: Self-pay | Admitting: Internal Medicine

## 2024-08-17 ENCOUNTER — Other Ambulatory Visit: Payer: Self-pay

## 2024-08-17 ENCOUNTER — Inpatient Hospital Stay (HOSPITAL_COMMUNITY)

## 2024-08-17 DIAGNOSIS — K8050 Calculus of bile duct without cholangitis or cholecystitis without obstruction: Secondary | ICD-10-CM | POA: Diagnosis not present

## 2024-08-17 HISTORY — PX: CHOLECYSTECTOMY: SHX55

## 2024-08-17 HISTORY — PX: INDOCYANINE GREEN FLUORESCENCE IMAGING (ICG): SHX7595

## 2024-08-17 LAB — COMPREHENSIVE METABOLIC PANEL WITH GFR
ALT: 17 U/L (ref 0–44)
AST: 16 U/L (ref 15–41)
Albumin: 3.1 g/dL — ABNORMAL LOW (ref 3.5–5.0)
Alkaline Phosphatase: 76 U/L (ref 38–126)
Anion gap: 10 (ref 5–15)
BUN: 16 mg/dL (ref 6–20)
CO2: 23 mmol/L (ref 22–32)
Calcium: 8.5 mg/dL — ABNORMAL LOW (ref 8.9–10.3)
Chloride: 104 mmol/L (ref 98–111)
Creatinine, Ser: 1.68 mg/dL — ABNORMAL HIGH (ref 0.61–1.24)
GFR, Estimated: 47 mL/min — ABNORMAL LOW (ref >60)
Glucose, Bld: 91 mg/dL (ref 70–99)
Potassium: 4 mmol/L (ref 3.5–5.1)
Sodium: 137 mmol/L (ref 135–145)
Total Bilirubin: 0.7 mg/dL (ref 0.0–1.2)
Total Protein: 6.2 g/dL — ABNORMAL LOW (ref 6.5–8.1)

## 2024-08-17 LAB — LIPASE, BLOOD: Lipase: 23 U/L (ref 11–51)

## 2024-08-17 LAB — CBC WITH DIFFERENTIAL/PLATELET
Abs Immature Granulocytes: 0.01 K/uL (ref 0.00–0.07)
Basophils Absolute: 0 K/uL (ref 0.0–0.1)
Basophils Relative: 0 %
Eosinophils Absolute: 0.1 K/uL (ref 0.0–0.5)
Eosinophils Relative: 2 %
HCT: 38.3 % — ABNORMAL LOW (ref 39.0–52.0)
Hemoglobin: 11.8 g/dL — ABNORMAL LOW (ref 13.0–17.0)
Immature Granulocytes: 0 %
Lymphocytes Relative: 49 %
Lymphs Abs: 2.5 K/uL (ref 0.7–4.0)
MCH: 23.7 pg — ABNORMAL LOW (ref 26.0–34.0)
MCHC: 30.8 g/dL (ref 30.0–36.0)
MCV: 77.1 fL — ABNORMAL LOW (ref 80.0–100.0)
Monocytes Absolute: 0.4 K/uL (ref 0.1–1.0)
Monocytes Relative: 7 %
Neutro Abs: 2.2 K/uL (ref 1.7–7.7)
Neutrophils Relative %: 42 %
Platelets: 218 K/uL (ref 150–400)
RBC: 4.97 MIL/uL (ref 4.22–5.81)
RDW: 15.9 % — ABNORMAL HIGH (ref 11.5–15.5)
WBC: 5.2 K/uL (ref 4.0–10.5)
nRBC: 0 % (ref 0.0–0.2)

## 2024-08-17 SURGERY — LAPAROSCOPIC CHOLECYSTECTOMY WITH INTRAOPERATIVE CHOLANGIOGRAM
Anesthesia: General | Site: Abdomen

## 2024-08-17 MED ORDER — EPINEPHRINE 1 MG/10ML IV SOSY
PREFILLED_SYRINGE | INTRAVENOUS | Status: DC | PRN
Start: 2024-08-17 — End: 2024-08-17
  Administered 2024-08-17: 10 ug via INTRAVENOUS

## 2024-08-17 MED ORDER — OXYCODONE HCL 5 MG PO TABS: 5.0000 mg | ORAL_TABLET | Freq: Once | ORAL | Status: DC | PRN

## 2024-08-17 MED ORDER — ORAL CARE MOUTH RINSE: 15.0000 mL | Freq: Once | OROMUCOSAL | Status: AC

## 2024-08-17 MED ORDER — ONDANSETRON HCL 4 MG/2ML IJ SOLN
INTRAMUSCULAR | Status: DC | PRN
  Administered 2024-08-17: 4 mg via INTRAVENOUS

## 2024-08-17 MED ORDER — PROPOFOL 10 MG/ML IV BOLUS
INTRAVENOUS | Status: DC | PRN
  Administered 2024-08-17: 60 mg via INTRAVENOUS
  Administered 2024-08-17: 140 mg via INTRAVENOUS

## 2024-08-17 MED ORDER — LIDOCAINE 2% (20 MG/ML) 5 ML SYRINGE
INTRAMUSCULAR | Status: DC | PRN
  Administered 2024-08-17: 60 mg via INTRAVENOUS

## 2024-08-17 MED ORDER — DEXAMETHASONE SOD PHOSPHATE PF 10 MG/ML IJ SOLN
INTRAMUSCULAR | Status: DC | PRN
  Administered 2024-08-17: 5 mg via INTRAVENOUS

## 2024-08-17 MED ORDER — OXYCODONE HCL 5 MG/5ML PO SOLN: 5.0000 mg | Freq: Once | ORAL | Status: DC | PRN

## 2024-08-17 MED ORDER — EPINEPHRINE 1 MG/10ML IV SOSY
PREFILLED_SYRINGE | INTRAVENOUS | Status: AC
  Filled 2024-08-17: qty 10

## 2024-08-17 MED ORDER — ACETAMINOPHEN 10 MG/ML IV SOLN: 1000.0000 mg | Freq: Once | INTRAVENOUS | Status: DC | PRN

## 2024-08-17 MED ORDER — RACEPINEPHRINE HCL 2.25 % IN NEBU
0.5000 mL | INHALATION_SOLUTION | Freq: Once | RESPIRATORY_TRACT | Status: AC
  Administered 2024-08-17: .5 mL via RESPIRATORY_TRACT
  Filled 2024-08-17: qty 15

## 2024-08-17 MED ORDER — FENTANYL CITRATE (PF) 100 MCG/2ML IJ SOLN
INTRAMUSCULAR | Status: AC
  Filled 2024-08-17: qty 2

## 2024-08-17 MED ORDER — ONDANSETRON HCL 4 MG/2ML IJ SOLN
4.0000 mg | Freq: Four times a day (QID) | INTRAMUSCULAR | Status: DC | PRN
  Administered 2024-08-17: 4 mg via INTRAVENOUS
  Filled 2024-08-17: qty 2

## 2024-08-17 MED ORDER — FENTANYL CITRATE (PF) 250 MCG/5ML IJ SOLN
INTRAMUSCULAR | Status: AC
  Filled 2024-08-17: qty 5

## 2024-08-17 MED ORDER — ACETAMINOPHEN 10 MG/ML IV SOLN
INTRAVENOUS | Status: AC
  Filled 2024-08-17: qty 100

## 2024-08-17 MED ORDER — PHENYLEPHRINE 80 MCG/ML (10ML) SYRINGE FOR IV PUSH (FOR BLOOD PRESSURE SUPPORT)
PREFILLED_SYRINGE | INTRAVENOUS | Status: AC
  Filled 2024-08-17: qty 10

## 2024-08-17 MED ORDER — SODIUM CHLORIDE 0.9 % IV SOLN
INTRAVENOUS | Status: AC
  Filled 2024-08-17: qty 20

## 2024-08-17 MED ORDER — BUPIVACAINE HCL 0.25 % IJ SOLN
INTRAMUSCULAR | Status: DC | PRN
  Administered 2024-08-17: 30 mL

## 2024-08-17 MED ORDER — 0.9 % SODIUM CHLORIDE (POUR BTL) OPTIME
TOPICAL | Status: DC | PRN
  Administered 2024-08-17: 1000 mL

## 2024-08-17 MED ORDER — CHLORHEXIDINE GLUCONATE 0.12 % MT SOLN
OROMUCOSAL | Status: AC
  Filled 2024-08-17: qty 15

## 2024-08-17 MED ORDER — ROCURONIUM BROMIDE 10 MG/ML (PF) SYRINGE
PREFILLED_SYRINGE | INTRAVENOUS | Status: DC | PRN
  Administered 2024-08-17: 10 mg via INTRAVENOUS
  Administered 2024-08-17: 50 mg via INTRAVENOUS

## 2024-08-17 MED ORDER — PROPOFOL 10 MG/ML IV BOLUS
INTRAVENOUS | Status: AC
Start: 2024-08-17 — End: 2024-08-17
  Filled 2024-08-17: qty 20

## 2024-08-17 MED ORDER — PHENYLEPHRINE 80 MCG/ML (10ML) SYRINGE FOR IV PUSH (FOR BLOOD PRESSURE SUPPORT)
PREFILLED_SYRINGE | INTRAVENOUS | Status: DC | PRN
  Administered 2024-08-17 (×3): 80 ug via INTRAVENOUS

## 2024-08-17 MED ORDER — IOHEXOL 300 MG/ML  SOLN
INTRAMUSCULAR | Status: DC | PRN
  Administered 2024-08-17: 7 mL

## 2024-08-17 MED ORDER — SODIUM CHLORIDE 0.9 % IR SOLN
Status: DC | PRN
  Administered 2024-08-17: 300 mL

## 2024-08-17 MED ORDER — SODIUM CHLORIDE 0.9 % IV SOLN: INTRAVENOUS | Status: DC | PRN

## 2024-08-17 MED ORDER — FENTANYL CITRATE (PF) 100 MCG/2ML IJ SOLN
25.0000 ug | INTRAMUSCULAR | Status: DC | PRN
  Administered 2024-08-17 (×2): 50 ug via INTRAVENOUS

## 2024-08-17 MED ORDER — FENTANYL CITRATE (PF) 250 MCG/5ML IJ SOLN
INTRAMUSCULAR | Status: DC | PRN
  Administered 2024-08-17 (×3): 50 ug via INTRAVENOUS
  Administered 2024-08-17: 100 ug via INTRAVENOUS

## 2024-08-17 MED ORDER — CHLORHEXIDINE GLUCONATE 0.12 % MT SOLN
15.0000 mL | Freq: Once | OROMUCOSAL | Status: AC
  Administered 2024-08-17: 15 mL via OROMUCOSAL

## 2024-08-17 MED ORDER — SODIUM CHLORIDE (PF) 0.9 % IJ SOLN
INTRAMUSCULAR | Status: AC
  Filled 2024-08-17: qty 10

## 2024-08-17 MED ORDER — PROPOFOL 10 MG/ML IV BOLUS
INTRAVENOUS | Status: AC
  Filled 2024-08-17: qty 20

## 2024-08-17 MED ORDER — SUGAMMADEX SODIUM 200 MG/2ML IV SOLN
INTRAVENOUS | Status: DC | PRN
  Administered 2024-08-17: 200 mg via INTRAVENOUS

## 2024-08-17 MED ORDER — DIPHENHYDRAMINE HCL 50 MG/ML IJ SOLN
INTRAMUSCULAR | Status: DC | PRN
  Administered 2024-08-17 (×2): 12.5 mg via INTRAVENOUS

## 2024-08-17 MED ORDER — SODIUM CHLORIDE 0.9 % IV SOLN
2.0000 g | INTRAVENOUS | Status: AC
  Administered 2024-08-17: 2 g via INTRAVENOUS

## 2024-08-17 MED ORDER — FAMOTIDINE IN NACL 20-0.9 MG/50ML-% IV SOLN
INTRAVENOUS | Status: DC | PRN
Start: 2024-08-17 — End: 2024-08-17
  Administered 2024-08-17: 20 mg via INTRAVENOUS

## 2024-08-17 MED ORDER — MIDAZOLAM HCL 5 MG/5ML IJ SOLN
INTRAMUSCULAR | Status: DC | PRN
  Administered 2024-08-17: 2 mg via INTRAVENOUS

## 2024-08-17 MED ORDER — BUPIVACAINE HCL (PF) 0.25 % IJ SOLN
INTRAMUSCULAR | Status: AC
  Filled 2024-08-17: qty 30

## 2024-08-17 MED ORDER — LACTATED RINGERS IV SOLN: INTRAVENOUS | Status: DC

## 2024-08-17 MED ORDER — ACETAMINOPHEN 10 MG/ML IV SOLN
INTRAVENOUS | Status: DC | PRN
  Administered 2024-08-17: 1000 mg via INTRAVENOUS

## 2024-08-17 MED ORDER — DIPHENHYDRAMINE HCL 50 MG/ML IJ SOLN
INTRAMUSCULAR | Status: AC
  Filled 2024-08-17: qty 1

## 2024-08-17 MED ORDER — INDOCYANINE GREEN 25 MG IV SOLR
1.2500 mg | INTRAVENOUS | Status: AC
  Administered 2024-08-17: 1.25 mg via INTRAVENOUS
  Filled 2024-08-17: qty 10

## 2024-08-17 MED ORDER — MIDAZOLAM HCL 2 MG/2ML IJ SOLN
INTRAMUSCULAR | Status: AC
  Filled 2024-08-17: qty 2

## 2024-08-17 MED ORDER — ONDANSETRON HCL 4 MG/2ML IJ SOLN
INTRAMUSCULAR | Status: AC
  Filled 2024-08-17: qty 2

## 2024-08-17 SURGICAL SUPPLY — 35 items
BAG COUNTER SPONGE SURGICOUNT (BAG) ×2 IMPLANT
CANISTER SUCTION 3000ML PPV (SUCTIONS) ×2 IMPLANT
CHLORAPREP W/TINT 26 (MISCELLANEOUS) ×2 IMPLANT
CLIP APPLIE ROT 10 11.4 M/L (STAPLE) IMPLANT
CLIP LIGATING HEMO O LOK GREEN (MISCELLANEOUS) IMPLANT
COVER MAYO STAND STRL (DRAPES) IMPLANT
COVER SURGICAL LIGHT HANDLE (MISCELLANEOUS) ×2 IMPLANT
DERMABOND ADVANCED .7 DNX12 (GAUZE/BANDAGES/DRESSINGS) ×2 IMPLANT
DRAPE C-ARM 42X72 X-RAY (DRAPES) IMPLANT
DRAPE LAPAROSCOPIC ABDOMINAL (DRAPES) IMPLANT
ELECTRODE REM PT RTRN 9FT ADLT (ELECTROSURGICAL) ×2 IMPLANT
GLOVE BIOGEL PI IND STRL 7.0 (GLOVE) ×2 IMPLANT
GLOVE SURG SS PI 7.0 STRL IVOR (GLOVE) ×2 IMPLANT
GOWN STRL REUS W/ TWL LRG LVL3 (GOWN DISPOSABLE) ×6 IMPLANT
GRASPER SUT TROCAR 14GX15 (MISCELLANEOUS) ×2 IMPLANT
IRRIGATION SUCT STRKRFLW 2 WTP (MISCELLANEOUS) ×2 IMPLANT
KIT BASIN OR (CUSTOM PROCEDURE TRAY) ×2 IMPLANT
KIT TURNOVER KIT B (KITS) ×2 IMPLANT
NDL 22X1.5 STRL (OR ONLY) (MISCELLANEOUS) ×2 IMPLANT
NEEDLE 22X1.5 STRL (OR ONLY) (MISCELLANEOUS) ×2 IMPLANT
PAD ARMBOARD POSITIONER FOAM (MISCELLANEOUS) ×2 IMPLANT
POUCH RETRIEVAL ECOSAC 10 (ENDOMECHANICALS) ×2 IMPLANT
SCISSORS LAP 5X35 DISP (ENDOMECHANICALS) ×2 IMPLANT
SET CHOLANGIOGRAPH 5 50 .035 (SET/KITS/TRAYS/PACK) IMPLANT
SET TUBE SMOKE EVAC HIGH FLOW (TUBING) ×2 IMPLANT
SLEEVE Z-THREAD 5X100MM (TROCAR) ×4 IMPLANT
SOLN 0.9% NACL POUR BTL 1000ML (IV SOLUTION) ×2 IMPLANT
SOLN STERILE WATER BTL 1000 ML (IV SOLUTION) ×2 IMPLANT
SUT MNCRL AB 4-0 PS2 18 (SUTURE) ×2 IMPLANT
SUT VICRYL 0 UR6 27IN ABS (SUTURE) IMPLANT
TOWEL GREEN STERILE FF (TOWEL DISPOSABLE) ×2 IMPLANT
TRAY LAPAROSCOPIC MC (CUSTOM PROCEDURE TRAY) ×2 IMPLANT
TROCAR Z THREAD OPTICAL 12X100 (TROCAR) ×2 IMPLANT
TROCAR Z-THREAD OPTICAL 5X100M (TROCAR) ×2 IMPLANT
WARMER LAPAROSCOPE (MISCELLANEOUS) ×2 IMPLANT

## 2024-08-17 NOTE — Anesthesia Preprocedure Evaluation (Addendum)
 Anesthesia Evaluation  Patient identified by MRN, date of birth, ID band Patient awake    Reviewed: Allergy & Precautions, NPO status , Patient's Chart, lab work & pertinent test results  History of Anesthesia Complications Negative for: history of anesthetic complications  Airway Mallampati: III  TM Distance: >3 FB Neck ROM: Full    Dental  (+) Teeth Intact, Dental Advisory Given   Pulmonary neg shortness of breath, sleep apnea and Continuous Positive Airway Pressure Ventilation , neg COPD, neg recent URI   breath sounds clear to auscultation       Cardiovascular (-) hypertension(-) angina + CAD  (-) Past MI and (-) CHF  Rhythm:Regular     Neuro/Psych    GI/Hepatic Neg liver ROS, PUD,GERD  ,,  Endo/Other    Renal/GU CRFRenal disease     Musculoskeletal negative musculoskeletal ROS (+)    Abdominal   Peds  Hematology  (+) Blood dyscrasia, anemia Lab Results      Component                Value               Date                      WBC                      5.2                 08/17/2024                HGB                      11.8 (L)            08/17/2024                HCT                      38.3 (L)            08/17/2024                MCV                      77.1 (L)            08/17/2024                PLT                      218                 08/17/2024              Anesthesia Other Findings   Reproductive/Obstetrics                              Anesthesia Physical Anesthesia Plan  ASA: 3  Anesthesia Plan: General   Post-op Pain Management: Ofirmev  IV (intra-op)*   Induction: Intravenous  PONV Risk Score and Plan: 3 and Ondansetron  and Dexamethasone  Airway Management Planned: Oral ETT  Additional Equipment: None  Intra-op Plan:   Post-operative Plan: Extubation in OR  Informed Consent: I have reviewed the patients History and Physical, chart, labs and  discussed the procedure including the risks, benefits and alternatives for the proposed anesthesia with  the patient or authorized representative who has indicated his/her understanding and acceptance.     Dental advisory given  Plan Discussed with: CRNA  Anesthesia Plan Comments:          Anesthesia Quick Evaluation

## 2024-08-17 NOTE — Progress Notes (Signed)
 TRH   ROUNDING   NOTE Kshawn Canal FMW:[id number]  DOB: 02/08/1966  DOA: 08/15/2024  PCP: Trude Bernarda BROCKS, MD  08/17/2024,9:08 AM  LOS: 2 days    Code Status: Full code     from: Home   58 year old black male Previous robotic sigmoid colectomy for complicated diverticulitis 1119-->09/02/2023 admission at [facility] Known thyroid  nodule PAD HLD BPH CKD stage II  Apparent extensive workup gastric emptying study that was normal in 2019 with previous workup for right upper quadrant discomfort with cholelithiasis without specific concerning findings  Admit 11/7 AM because of chest abdomen discomfort etc. from [facility] with concern for acute cholecystitis in the setting of gallbladder pain/colic   Pertinent imaging/studies till date  11/6 CT angio = no evidence thoracoabdominal aneurysm pulmonary emboli mild cardiomegaly cholelithiasis without cholecystitis stable thyroid  goiter 11/6 US  abdomen shows cholelithiasis without acute cholecystitis 11/7 HIDA grossly positive for cholecystitis   Assessment  & Plan :    Abdominal pain not otherwise specified--concern for acalculous cholecystitis? Underlying history of globus laryngeal spasm esophageal spasm noted in his [facility] regional chart    [-] GES 2019 HIDA scan as above suggestive of acute cholecystitis Defer to general surgery planning for surgery Pain control Toradol  30 every 6 as needed Dilaudid 0.5 Q2 as needed-he is much more comfortable in terms of pain Ceftriaxone  perioperative Postop diet etc. precautions as per surgery Previous robotic sigmoid colectomy  [place] No issues per se PAD HLD Not currently on any meds for the same BPH History elevated PSA Continue Flomax  0.4 To follow closely with outpatient physician CKD 3 Trends to be monitored with labs  Data Reviewed today:    sodium 137 potassium 4.0 BUN/creatinine 16/1.6 WBC 5.2 platelet 218  hemoglobin 11.8   DVT prophylaxis: SCD at this time  Status is: Inpatient Inpatient pending resolution/determination of next steps  Dispo/Global plan: Inpatient   Time 23   Subjective:    Looks comfortable feels better overall seems confident about surgery Is n.p.o. We had a chit chat with his wife  Objective + exam Vitals:   08/16/24 1229 08/16/24 1720 08/16/24 2221 08/17/24 0558  BP: 118/62 128/75 124/74 111/64  Pulse: 85 74 78 77  Resp: 14 16 17 17   Temp: 98.2 F (36.8 C) 98 F (36.7 C) 98.3 F (36.8 C) 98.1 F (36.7 C)  TempSrc: Oral Oral Oral Oral  SpO2: 99% 100% 97% 93%  Weight:      Height:       Filed Weights   08/15/24 2335  Weight: 106 kg     Examination:  EOMI NCAT pleasant neck soft supple S1-S2 no murmur slight tachycardia Abdomen now tender over right upper quadrant Slight distention no rebound no guarding No lower extremity edema  Scheduled Meds:  indocyanine green  1.25 mg Intravenous On Call to OR   tamsulosin   0.4 mg Oral BID   Continuous Infusions:  cefTRIAXone  (ROCEPHIN )  IV     camphor-menthol, diphenhydrAMINE , HYDROmorphone (DILAUDID) injection, ketorolac , mouth rinse, zolpidem   Jai-Gurmukh Kutler Vanvranken, MD  [facility]

## 2024-08-17 NOTE — Plan of Care (Incomplete)
   Problem: Education: Goal: Knowledge of General Education information will improve Description Including pain rating scale, medication(s)/side effects and non-pharmacologic comfort measures Outcome: Progressing

## 2024-08-17 NOTE — Transfer of Care (Signed)
 Immediate Anesthesia Transfer of Care Note  Patient: Samuel Patrick  Procedure(s) Performed: LAPAROSCOPIC CHOLECYSTECTOMY WITH INTRAOPERATIVE CHOLANGIOGRAM INDOCYANINE GREEN FLUORESCENCE IMAGING (ICG) (Abdomen)  Patient Location: PACU  Anesthesia Type:General  Level of Consciousness: awake, alert , oriented, and patient cooperative  Airway & Oxygen Therapy: Patient Spontanous Breathing and Patient connected to face mask oxygen  Post-op Assessment: Report given to RN, Post -op Vital signs reviewed and stable, and Patient moving all extremities X 4  Post vital signs: Reviewed and stable  Last Vitals:  Vitals Value Taken Time  BP 164/93 08/17/24 14:39  Temp    Pulse 93 08/17/24 14:44  Resp 10 08/17/24 14:44  SpO2 100 % 08/17/24 14:44  Vitals shown include unfiled device data.  Last Pain:  Vitals:   08/17/24 0750  TempSrc:   PainSc: 8       Patients Stated Pain Goal: 2 (08/17/24 0135)  Complications: No notable events documented.

## 2024-08-17 NOTE — Op Note (Signed)
 PATIENT:  Samuel Patrick  58 y.o. male  PRE-OPERATIVE DIAGNOSIS:  Gallstones  POST-OPERATIVE DIAGNOSIS:  Gallstones  PROCEDURE:  Procedure(s): LAPAROSCOPIC CHOLECYSTECTOMY WITH INTRAOPERATIVE CHOLANGIOGRAM INDOCYANINE GREEN FLUORESCENCE IMAGING (ICG)   SURGEON:  Damiana Berrian, Herlene Righter, MD   ASSISTANT: none  ANESTHESIA:   local and general  Indications for procedure: Samuel Patrick is a 58 y.o. male with symptoms of Abdominal pain consistent with gallbladder disease, Confirmed by ultrasound and nuclear medicine study.  Description of procedure: The patient was brought into the operative suite, placed supine. Anesthesia was administered with endotracheal tube. Patient was strapped in place and foot board was secured. All pressure points were offloaded by foam padding. The patient was prepped and draped in the usual sterile fashion.  A periumbilical incision was made and optical entry was used to enter the abdomen. 2 5 mm trocars were placed on in the right lateral space on in the right subcostal space. A 12mm trocar was placed in the subxiphoid space. Marcaine was infused to the subxiphoid space and lateral upper right abdomen in the transversus abdominis plane. Next the patient was placed in reverse trendelenberg. The gallbladder appearedfull of stones and acutely inflamed.   The gallbladder was retracted cephalad and lateral. The peritoneum was reflected off the infundibulum working lateral to medial. The cystic duct and cystic artery were identified and further dissection revealed a critical view, due to concern for choledocholithiasis a cholangiogram was performed with ductotomy and cook catheter passed through a separate subcostal stab incision. Normal ductal anatomy was seen with emptying into the duodenum. The cystic duct and cystic artery were doubly clipped and ligated.   The gallbladder was removed off the liver bed with cautery. The Gallbladder was placed in a specimen bag. The  gallbladder fossa was irrigated and hemostasis was applied with cautery. The gallbladder was removed via the 12mm trocar. The fascial defect was closed with interrupted 0 vicryl suture via laparoscopic trans-fascial suture passer. Pneumoperitoneum was removed, all trocar were removed. All incisions were closed with 4-0 monocryl subcuticular stitch. The patient woke from anesthesia and was brought to PACU in stable condition. All counts were correct  Findings: acute cholecystitis with large impacted ston in the neck  Specimen: gallbladder  Blood loss: 50 ml  Local anesthesia: 30 ml Marcaine  Complications: none  PLAN OF CARE: Admit to inpatient   PATIENT DISPOSITION:  PACU - hemodynamically stable.   Herlene Righter [facility], [place]

## 2024-08-17 NOTE — Anesthesia Procedure Notes (Addendum)
 Procedure Name: Intubation Date/Time: 08/17/2024 1:00 PM  Performed by: Claudene Arlin LABOR, CRNAPre-anesthesia Checklist: Patient identified, Emergency Drugs available, Suction available and Patient being monitored Patient Re-evaluated:Patient Re-evaluated prior to induction Oxygen Delivery Method: Circle system utilized Preoxygenation: Pre-oxygenation with 100% oxygen Induction Type: IV induction Ventilation: Mask ventilation without difficulty and Oral airway inserted - appropriate to patient size Laryngoscope Size: 2 and Miller Grade View: Grade II Tube type: Oral Tube size: 7.5 mm Number of attempts: 1 Airway Equipment and Method: Stylet and Oral airway Placement Confirmation: ETT inserted through vocal cords under direct vision, positive ETCO2 and breath sounds checked- equal and bilateral Secured at: 22 cm Tube secured with: Tape Dental Injury: Teeth and Oropharynx as per pre-operative assessment

## 2024-08-17 NOTE — Progress Notes (Signed)
      Chief Complaint/Subjective: Persistent epigastric pain  Objective: Vital signs in last 24 hours: Temp:  [98 F (36.7 C)-98.3 F (36.8 C)] 98.1 F (36.7 C) (11/08 0558) Pulse Rate:  [74-85] 77 (11/08 0558) Resp:  [14-17] 17 (11/08 0558) BP: (111-128)/(62-75) 111/64 (11/08 0558) SpO2:  [93 %-100 %] 93 % (11/08 0558) Last BM Date :  (PTA) Intake/Output from previous day: 11/07 0701 - 11/08 0700 In: 240 [P.O.:240] Out: 400 [Urine:400]  PE: Gen: NAD Resp: nonlabored Card: RRR Abd: tender in epigastrium  Lab Results:  Recent Labs    08/16/24 0206 08/17/24 0731  WBC 5.5 5.2  HGB 13.6 11.8*  HCT 44.6 38.3*  PLT 234 218   Recent Labs    08/15/24 1457 08/16/24 0206  NA 141 137  K 4.3 4.8  CL 107 108  CO2 25 19*  GLUCOSE 105* 138*  BUN 14 13  CREATININE 1.55* 1.46*  CALCIUM 9.8 9.0   No results for input(s): LABPROT, INR in the last 72 hours.    Component Value Date/Time   NA 137 08/16/2024 0206   K 4.8 08/16/2024 0206   CL 108 08/16/2024 0206   CO2 19 (L) 08/16/2024 0206   GLUCOSE 138 (H) 08/16/2024 0206   BUN 13 08/16/2024 0206   CREATININE 1.46 (H) 08/16/2024 0206   CALCIUM 9.0 08/16/2024 0206   PROT 7.4 08/16/2024 0206   ALBUMIN 3.6 08/16/2024 0206   AST 24 08/16/2024 0206   ALT 21 08/16/2024 0206   ALKPHOS 99 08/16/2024 0206   BILITOT 0.6 08/16/2024 0206   GFRNONAA 55 (L) 08/16/2024 0206    Assessment/Plan 58 yo male with gallstones, persistent abdominal pain, and nonfilling gallbladder on hida. -OR for lap chole with ioc -IV abx -We discussed the etiology of her pain, we discussed treatment options and recommended surgery. We discussed details of surgery including general anesthesia, laparoscopic approach, identification of cystic duct and common bile duct. Ligation of cystic duct and cystic artery. Possible need for intraoperative cholangiogram or open procedure. Possible risks of common bile duct injury, liver injury, cystic duct leak,  bleeding, infection, post-cholecystectomy syndrome. The patient showed good understanding and all questions were answered  FEN - NPO VTE - SCDs ID - ceftriaxone  Disposition - OR today   LOS: 2 days   I reviewed last 24 h vitals and pain scores, last 48 h intake and output, last 24 h labs and trends, and last 24 h imaging results.  This care required high  level of medical decision making.   Herlene Righter [facility] at [facility] 08/17/2024, 8:07 AM Please see Amion for pager number during day hours 7:00am-4:30pm or 7:00am -11:30am on weekends

## 2024-08-17 NOTE — OR Nursing (Signed)
 Surgeon was made aware of patient's allergy to contrast prior to surgery start. In short stay, patient stated his reaction to contrast was itching; however, wife stated anaphylaxis. Surgeon decided to give contrast during the case after patient was premedicated with benadryl .

## 2024-08-18 DIAGNOSIS — K8050 Calculus of bile duct without cholangitis or cholecystitis without obstruction: Secondary | ICD-10-CM | POA: Diagnosis not present

## 2024-08-18 LAB — CBC
HCT: 35.9 % — ABNORMAL LOW (ref 39.0–52.0)
Hemoglobin: 11.2 g/dL — ABNORMAL LOW (ref 13.0–17.0)
MCH: 23.6 pg — ABNORMAL LOW (ref 26.0–34.0)
MCHC: 31.2 g/dL (ref 30.0–36.0)
MCV: 75.7 fL — ABNORMAL LOW (ref 80.0–100.0)
Platelets: 265 K/uL (ref 150–400)
RBC: 4.74 MIL/uL (ref 4.22–5.81)
RDW: 15.5 % (ref 11.5–15.5)
WBC: 8.3 K/uL (ref 4.0–10.5)
nRBC: 0 % (ref 0.0–0.2)

## 2024-08-18 LAB — COMPREHENSIVE METABOLIC PANEL WITH GFR
ALT: 46 U/L — ABNORMAL HIGH (ref 0–44)
AST: 41 U/L (ref 15–41)
Albumin: 3.3 g/dL — ABNORMAL LOW (ref 3.5–5.0)
Alkaline Phosphatase: 78 U/L (ref 38–126)
Anion gap: 13 (ref 5–15)
BUN: 17 mg/dL (ref 6–20)
CO2: 23 mmol/L (ref 22–32)
Calcium: 8.7 mg/dL — ABNORMAL LOW (ref 8.9–10.3)
Chloride: 98 mmol/L (ref 98–111)
Creatinine, Ser: 1.78 mg/dL — ABNORMAL HIGH (ref 0.61–1.24)
GFR, Estimated: 44 mL/min — ABNORMAL LOW (ref >60)
Glucose, Bld: 128 mg/dL — ABNORMAL HIGH (ref 70–99)
Potassium: 4 mmol/L (ref 3.5–5.1)
Sodium: 134 mmol/L — ABNORMAL LOW (ref 135–145)
Total Bilirubin: 0.7 mg/dL (ref 0.0–1.2)
Total Protein: 6.8 g/dL (ref 6.5–8.1)

## 2024-08-18 MED ORDER — SENNOSIDES-DOCUSATE SODIUM 8.6-50 MG PO TABS
1.0000 | ORAL_TABLET | Freq: Two times a day (BID) | ORAL | Status: DC
  Administered 2024-08-18 – 2024-08-19 (×3): 1 via ORAL
  Filled 2024-08-18 (×3): qty 1

## 2024-08-18 MED ORDER — FLEET ENEMA RE ENEM
1.0000 | ENEMA | Freq: Once | RECTAL | Status: AC
  Administered 2024-08-18: 1 via RECTAL
  Filled 2024-08-18: qty 1

## 2024-08-18 MED ORDER — NAPROXEN 250 MG PO TABS
375.0000 mg | ORAL_TABLET | Freq: Two times a day (BID) | ORAL | Status: DC
  Administered 2024-08-18 – 2024-08-19 (×2): 375 mg via ORAL
  Filled 2024-08-18 (×2): qty 2

## 2024-08-18 MED ORDER — OXYCODONE-ACETAMINOPHEN 7.5-325 MG PO TABS
1.0000 | ORAL_TABLET | ORAL | Status: DC | PRN
  Administered 2024-08-18 – 2024-08-19 (×4): 1 via ORAL
  Filled 2024-08-18 (×4): qty 1

## 2024-08-18 MED ORDER — POLYETHYLENE GLYCOL 3350 17 G PO PACK
17.0000 g | PACK | Freq: Two times a day (BID) | ORAL | Status: DC
  Administered 2024-08-18 – 2024-08-19 (×3): 17 g via ORAL
  Filled 2024-08-18 (×3): qty 1

## 2024-08-18 NOTE — Discharge Instructions (Signed)
 [facility] ______[facility] LAPAROSCOPIC SURGERY: POST OP INSTRUCTIONS Always review your discharge instruction sheet given to you by the facility where your surgery was performed. IF YOU HAVE DISABILITY OR FAMILY LEAVE FORMS, YOU MUST BRING THEM TO THE OFFICE FOR PROCESSING.   DO NOT GIVE THEM TO YOUR DOCTOR.  A prescription for pain medication may be given to you upon discharge.  Take your pain medication as prescribed, if needed.  If narcotic pain medicine is not needed, then you may take acetaminophen  (Tylenol ) or ibuprofen  (Advil ) as needed. Take your usually prescribed medications unless otherwise directed. If you need a refill on your pain medication, please contact your pharmacy.  They will contact our office to request authorization. Prescriptions will not be filled after 5pm or on week-ends. You should follow a light diet the first few days after arrival home, such as soup and crackers, etc.  Be sure to include lots of fluids daily. Most patients will experience some swelling and bruising in the area of the incisions.  Ice packs will help.  Swelling and bruising can take several days to resolve.  It is common to experience some constipation if taking pain medication after surgery.  Increasing fluid intake and taking a stool softener (such as Colace) will usually help or prevent this problem from occurring.  A mild laxative (Milk of Magnesia or Miralax) should be taken according to package instructions if there are no bowel movements after 48 hours. Unless discharge instructions indicate otherwise, you may remove your bandages 24-48 hours after surgery, and you may shower at that time.  You may have steri-strips (small skin tapes) in place directly over the incision.  These strips should be left on the skin for 7-10 days.  If your surgeon used skin glue on the incision, you may shower in 24 hours.  The glue will flake off over the next 2-3 weeks.  Any sutures or staples will be  removed at the office during your follow-up visit. ACTIVITIES:  You may resume regular (light) daily activities beginning the next day--such as daily self-care, walking, climbing stairs--gradually increasing activities as tolerated.  You may have sexual intercourse when it is comfortable.  Refrain from any heavy lifting or straining until approved by your doctor. You may drive when you are no longer taking prescription pain medication, you can comfortably wear a seatbelt, and you can safely maneuver your car and apply brakes. RETURN TO WORK:  __________________________________________________________ Samuel Patrick should see your doctor in the office for a follow-up appointment approximately 2-3 weeks after your surgery.  Make sure that you call for this appointment within a day or two after you arrive home to insure a convenient appointment time. OTHER INSTRUCTIONS: __________________________________________________________________________________________________________________________ __________________________________________________________________________________________________________________________ WHEN TO CALL YOUR DOCTOR: Fever over 101.0 Inability to urinate Continued bleeding from incision. Increased pain, redness, or drainage from the incision. Increasing abdominal pain  The clinic staff is available to answer your questions during regular business hours.  Please don't hesitate to call and ask to speak to one of the nurses for clinical concerns.  If you have a medical emergency, go to the nearest emergency room or call 911.  A surgeon from [facility] is always on call at the hospital. [address], [address], [place], [place]  [address] ? [address] [address], [place], [place]   [address] [phone] ? [phone] ? FAX [phone] Web site: [email or web address]

## 2024-08-18 NOTE — Progress Notes (Signed)
 TR[facility]   ROUNDING   NOTE Samuel Patrick FMW:[id number]  DOB: October 31, 1965  DOA: 08/15/2024  PCP: Trude Bernarda BROCKS, MD  08/18/2024,11:35 AM  LOS: 3 days    Code Status: Full code     from: Home   58 year old black male Previous robotic sigmoid colectomy for complicated diverticulitis 1119-->09/02/2023 admission at [facility] Known thyroid  nodule PAD HLD BPH CKD stage II  Apparent extensive workup gastric emptying study that was normal in 2019 with previous workup for right upper quadrant discomfort with cholelithiasis without specific concerning findings  Admit 11/7 AM because of chest abdomen discomfort etc. from [facility] with concern for acute cholecystitis in the setting of gallbladder pain/colic   Pertinent imaging/studies till date  11/6 CT angio = no evidence thoracoabdominal aneurysm pulmonary emboli mild cardiomegaly cholelithiasis without cholecystitis stable thyroid  goiter 11/6 US  abdomen shows cholelithiasis without acute cholecystitis 11/7 HIDA grossly positive for cholecystitis   Assessment  & Plan :    Abdominal pain not otherwise specified--concern for acalculous cholecystitis? Underlying history of globus laryngeal spasm esophageal spasm noted in his [facility] regional chart    [-] GES 2019 .  Pain is uncontrolled--- adjustments made-Naprosyn 375 twice daily, Oxy IR 1 every 3 as needed moderate pain, Dilaudid 0.5 every 2 as needed severe pain Up in ambulatory No stool since admission giving MiraLAX  and senna and fleets in escalating fashion as needed Previous robotic sigmoid colectomy  [place] No issues per se PAD HLD Not currently on any meds for the same BPH History elevated PSA Continue Flomax  0.4 To follow closely with outpatient physician CKD 3 Trends to be monitored with labs  Data Reviewed today:   Sodium 134 potassium 4.0 BUN/creatinine 17/1.7 WBC 8.3 hemoglobin 11.2 platelet  265   DVT prophylaxis: SCD at this time  Status is: Inpatient Inpatient pending resolution/determination of next steps  Dispo/Global plan: Inpatient   Time 23   Subjective:    Quite uncomfortable appearing and tachycardic and anxious He is in some painful distress He feels distended is burping has not had a stool or gas He is tolerating diet had some mashed potatoes last night etc.   Objective + exam Vitals:   08/17/24 1646 08/17/24 2221 08/18/24 0508 08/18/24 1002  BP: (!) 147/89 123/73 115/69 135/85  Pulse: 94 (!) 107 (!) 109 (!) 121  Resp: 16 18 18 18   Temp: 97.7 F (36.5 C) 98.5 F (36.9 C) 98.7 F (37.1 C) 98.8 F (37.1 C)  TempSrc: Oral Oral  Oral  SpO2: 99% 93% 97% 96%  Weight:      Height:       Filed Weights   08/15/24 2335 08/17/24 1047  Weight: 106 kg 105.2 kg     Examination:  EOMI NCAT quite anxious and seems to be in some painful distress S1-S2 no murmur slight tachycardia Abdomen is distended no rebound no guarding.  Areas noted No lower extremity edema Power 5/5   Scheduled Meds:  naproxen  375 mg Oral BID WC   polyethylene glycol  17 g Oral BID   senna-docusate  1 tablet Oral BID   sodium phosphate  1 enema Rectal Once   tamsulosin   0.4 mg Oral BID   Continuous Infusions:   camphor-menthol, diphenhydrAMINE , HYDROmorphone (DILAUDID) injection, ketorolac , ondansetron  (ZOFRAN ) IV, mouth rinse, oxyCODONE -acetaminophen , zolpidem   Samuel Zamorah Ailes, MD  [facility]

## 2024-08-18 NOTE — Progress Notes (Signed)
 Patient ID: Samuel Patrick, male   DOB: 07-11-1966, 58 y.o.   MRN: [id number] 1 Day Post-Op    Subjective: Quite sore, taking PO ROS negative except as listed above. Objective: Vital signs in last 24 hours: Temp:  [97.7 F (36.5 C)-98.7 F (37.1 C)] 98.7 F (37.1 C) (11/09 0508) Pulse Rate:  [78-109] 109 (11/09 0508) Resp:  [10-18] 18 (11/09 0508) BP: (115-164)/(69-93) 115/69 (11/09 0508) SpO2:  [92 %-100 %] 97 % (11/09 0508) Weight:  [105.2 kg] 105.2 kg (11/08 1047) Last BM Date : 08/15/24  Intake/Output from previous day: 11/08 0701 - 11/09 0700 In: 470 [I.V.:320; IV Piggyback:150] Out: 540 [Urine:525; Blood:15] Intake/Output this shift: No intake/output data recorded.  GI: soft, expected soreness, incisions CDI  Lab Results: CBC  Recent Labs    08/17/24 0731 08/18/24 0519  WBC 5.2 8.3  HGB 11.8* 11.2*  HCT 38.3* 35.9*  PLT 218 265   BMET Recent Labs    08/17/24 0731 08/18/24 0519  NA 137 134*  K 4.0 4.0  CL 104 98  CO2 23 23  GLUCOSE 91 128*  BUN 16 17  CREATININE 1.68* 1.78*  CALCIUM 8.5* 8.7*   PT/INR No results for input(s): LABPROT, INR in the last 72 hours. ABG No results for input(s): PHART, HCO3 in the last 72 hours.  Invalid input(s): PCO2, PO2  Studies/Results: NM Hepatobiliary Liver Func Result Date: 08/16/2024 EXAM: NM HEPATOBILLARY SCAN 08/16/2024 04:44:08 PM TECHNIQUE: RADIOPHARMACEUTICAL: 5 mCi Tc-11m mebrofenin Dynamic images of the abdomen and pelvis were obtained in the anterior projection for 1 hour after intravenous administration of radiopharmaceutical. Morphine  augmentation was attempted. Upon administration of morphine , the patient vomited and was unable to continue the study. Additional 30 minutes of imaging were performed. COMPARISON: None available. CLINICAL HISTORY: Cholecystitis. FINDINGS: Homogenous uptake within the liver. Normal clearance of the blood pool. Appropriate excretion into the biliary system. Activity  is visualized in the small bowel. Gallbladder fails to fill over 90 minutes of imaging. This is concerning for acute cholecystitis. Morphine  augmentation was attempted. Upon administration of morphine , the patient vomited and was unable to continue the study. Morphine  augmentation could not be completed due to nausea. IMPRESSION: 1. Non-filling of the gallbladder over 90 minutes is concerning for acute cholecystitis. 2. Morphine  augmentation was not completed due to nausea and vomiting. 3. These results will be called to the ordering clinician or representative by the radiologist assistant, and communication documented in the pacs or clario dashboard Electronically signed by: Norleen Boxer MD 08/16/2024 05:09 PM EST RP Workstation: [id number]    Anti-infectives: Anti-infectives (From admission, onward)    Start     Dose/Rate Route Frequency Ordered Stop   08/17/24 0900  cefTRIAXone  (ROCEPHIN ) 2 g in sodium chloride  0.9 % 100 mL IVPB        2 g 200 mL/hr over 30 Minutes Intravenous On call to O.R. 08/17/24 0809 08/17/24 1659       Assessment/Plan: POD#1 S/P lap chole, IOC by Dr. Stevie -reg diet -OK for D/C from surgery standpoint, F/U at [facility] 2 weeks -no lifting over 10lbs for 4 weeks, instructions in chart   LOS: 3 days    Dann Hummer, MD, MPH, FACS [facility] Use [email or web address] to contact on call provider  08/18/2024

## 2024-08-18 NOTE — Plan of Care (Signed)
   Problem: Education: Goal: Knowledge of General Education information will improve Description Including pain rating scale, medication(s)/side effects and non-pharmacologic comfort measures Outcome: Progressing   Problem: Health Behavior/Discharge Planning: Goal: Ability to manage health-related needs will improve Outcome: Progressing

## 2024-08-18 NOTE — Plan of Care (Signed)

## 2024-08-18 NOTE — Progress Notes (Signed)
   08/18/24 1002  Assess: MEWS Score  Temp 98.8 F (37.1 C)  BP 135/85  MAP (mmHg) 95  Pulse Rate (!) 121  ECG Heart Rate (!) 121  Resp 18  SpO2 96 %  O2 Device Room Air  Assess: MEWS Score  MEWS Temp 0  MEWS Systolic 0  MEWS Pulse 2  MEWS RR 0  MEWS LOC 0  MEWS Score 2  MEWS Score Color Yellow  Assess: if the MEWS score is Yellow or Red  Were vital signs accurate and taken at a resting state? Yes  Does the patient meet 2 or more of the SIRS criteria? No  MEWS guidelines implemented  Yes, yellow  Treat  MEWS Interventions Considered administering scheduled or prn medications/treatments as ordered  Take Vital Signs  Increase Vital Sign Frequency  Yellow: Q2hr x1, continue Q4hrs until patient remains green for 12hrs  Escalate  MEWS: Escalate Yellow: Discuss with charge nurse and consider notifying provider and/or RRT  Notify: Charge Nurse/RN  Name of Charge Nurse/RN Notified Yoli RN  Assess: SIRS CRITERIA  SIRS Temperature  0  SIRS Respirations  0  SIRS Pulse 1  SIRS WBC 0  SIRS Score Sum  1     Dr. Royal notified when HR rechecked and was still 121. Dr. Royal en route to bedside at 1042.

## 2024-08-19 ENCOUNTER — Encounter (HOSPITAL_COMMUNITY): Payer: Self-pay | Admitting: General Surgery

## 2024-08-19 DIAGNOSIS — K8050 Calculus of bile duct without cholangitis or cholecystitis without obstruction: Secondary | ICD-10-CM | POA: Diagnosis not present

## 2024-08-19 LAB — COMPREHENSIVE METABOLIC PANEL WITH GFR
ALT: 43 U/L (ref 0–44)
AST: 33 U/L (ref 15–41)
Albumin: 3 g/dL — ABNORMAL LOW (ref 3.5–5.0)
Alkaline Phosphatase: 65 U/L (ref 38–126)
Anion gap: 10 (ref 5–15)
BUN: 17 mg/dL (ref 6–20)
CO2: 26 mmol/L (ref 22–32)
Calcium: 8.4 mg/dL — ABNORMAL LOW (ref 8.9–10.3)
Chloride: 101 mmol/L (ref 98–111)
Creatinine, Ser: 1.7 mg/dL — ABNORMAL HIGH (ref 0.61–1.24)
GFR, Estimated: 46 mL/min — ABNORMAL LOW (ref >60)
Glucose, Bld: 108 mg/dL — ABNORMAL HIGH (ref 70–99)
Potassium: 3.9 mmol/L (ref 3.5–5.1)
Sodium: 137 mmol/L (ref 135–145)
Total Bilirubin: 0.9 mg/dL (ref 0.0–1.2)
Total Protein: 6 g/dL — ABNORMAL LOW (ref 6.5–8.1)

## 2024-08-19 LAB — CBC
HCT: 28.7 % — ABNORMAL LOW (ref 39.0–52.0)
HCT: 29.1 % — ABNORMAL LOW (ref 39.0–52.0)
Hemoglobin: 9.1 g/dL — ABNORMAL LOW (ref 13.0–17.0)
Hemoglobin: 9.2 g/dL — ABNORMAL LOW (ref 13.0–17.0)
MCH: 24.1 pg — ABNORMAL LOW (ref 26.0–34.0)
MCH: 24 pg — ABNORMAL LOW (ref 26.0–34.0)
MCHC: 31.7 g/dL (ref 30.0–36.0)
MCHC: 31.6 g/dL (ref 30.0–36.0)
MCV: 75.9 fL — ABNORMAL LOW (ref 80.0–100.0)
MCV: 76 fL — ABNORMAL LOW (ref 80.0–100.0)
Platelets: 199 K/uL (ref 150–400)
Platelets: 222 K/uL (ref 150–400)
RBC: 3.78 MIL/uL — ABNORMAL LOW (ref 4.22–5.81)
RBC: 3.83 MIL/uL — ABNORMAL LOW (ref 4.22–5.81)
RDW: 15.4 % (ref 11.5–15.5)
RDW: 15.4 % (ref 11.5–15.5)
WBC: 6.4 K/uL (ref 4.0–10.5)
WBC: 6.7 K/uL (ref 4.0–10.5)
nRBC: 0 % (ref 0.0–0.2)
nRBC: 0 % (ref 0.0–0.2)

## 2024-08-19 MED ORDER — OXYCODONE-ACETAMINOPHEN 7.5-325 MG PO TABS: 1.0000 | ORAL_TABLET | ORAL | 0 refills | Status: AC | PRN

## 2024-08-19 MED ORDER — POLYETHYLENE GLYCOL 3350 17 G PO PACK: 17.0000 g | PACK | Freq: Two times a day (BID) | ORAL | 0 refills | Status: AC

## 2024-08-19 NOTE — Discharge Summary (Signed)
 Physician Discharge Summary  Samuel Patrick FMW:[id number] DOB: [birth year] DOA: 08/15/2024  PCP: Trude Bernarda BROCKS, MD  Admit date: 08/15/2024 Discharge date: 08/19/2024  Time spent: 23 minutes  Recommendations for Outpatient Follow-up:  Outpatient Chem-12 CBC in about 1 week Evaluate outpatient heart rate if still elevated may need workup but do not anticipate further needs Make sure it is followed for PSA as an outpatient and follows with his gastroenterologist  Discharge Diagnoses:  MAIN problem for hospitalization   Acute cholecystitis  Please see below for itemized issues addressed in HOpsital- refer to other progress notes for clarity if needed  Discharge Condition: Improved  Diet recommendation: Soft heart healthy  Filed Weights   08/15/24 2335 08/17/24 1047  Weight: 106 kg 67.71 kg    58 year old black male Previous robotic sigmoid colectomy for complicated diverticulitis 1119-->09/02/2023 admission at [facility] Known thyroid  nodule PAD HLD BPH CKD stage II   Apparent extensive workup gastric emptying study that was normal in 2019 with previous workup for right upper quadrant discomfort with cholelithiasis without specific concerning findings   Admit 11/7 AM because of chest abdomen discomfort etc. from [facility] with concern for acute cholecystitis in the setting of gallbladder pain/colic    Pertinent imaging/studies till date  11/6 CT angio = no evidence thoracoabdominal aneurysm pulmonary emboli mild cardiomegaly cholelithiasis without cholecystitis stable thyroid  goiter 11/6 US  abdomen shows cholelithiasis without acute cholecystitis 11/7 HIDA grossly positive for cholecystitis 08/17/2024 underwent lap chole    Assessment  & Plan :      Abdominal pain-HIDA scan showed cholecystitis which was acute Underlying history of globus laryngeal spasm esophageal spasm noted in his [facility]  regional chart    [-] GES 2019 Status post lap chole doing fair and eating drinking He is stable for postop management at home he will be given a prescription of MiraLAX  and I have told him to use Tylenol  and the oxycodone  and escalating fashions as needed He should follow-up with general surgery in the outpatient setting Sinus tach Probably related to anxiety/pain-trend is coming down I do not suspect any other etiology Should be followed in the outpatient office carefully Dilutional anemia anemia of expected blood loss Hemoglobin has remained pretty stable but dropped about 1-1/2 points since surgery We rechecked the hemoglobin and it was stable in the 9 range As an outpatient start iron or recheck in 2 to 3 weeks Has been cleared to go home Previous robotic sigmoid colectomy  [place] No issues per se He may require colonoscopy in the outpatient setting if not done previously-he does have a mild microcytosis PAD HLD Not currently on any meds for the same BPH History elevated PSA Continue Flomax  0.4 To follow closely with outpatient physician CKD 3 Trends to be monitored with labs  Discharge Exam: Vitals:   08/19/24 0623 08/19/24 0954  BP: (!) 117/56 111/74  Pulse: (!) 102 100  Resp: 18 17  Temp: 98.2 F (36.8 C) 98.5 F (36.9 C)  SpO2: 93% 92%    Subj on day of d/c   Pleasant coherent awake alert sitting up Ambulatory No fever no chills  General Exam on discharge  EOMI NCAT no focal deficit Hello no icterus no pallor Chest is clear Abdomen soft no rebound No lower extremity edema  Discharge Instructions   Discharge Instructions     Diet - low sodium heart healthy   Complete by: As directed    Discharge instructions  Complete by: As directed    Follow General surgery advice and recommendations If u have high grade fever chills etc. you need to come back to be seen  Get labs in a week at your regular physician's office   Increase activity slowly    Complete by: As directed       Allergies as of 08/19/2024       Reactions   Hydromorphone Itching   Iodinated Contrast Media Anaphylaxis   Gadolinium    Triptans Other (See Comments)   Migraines with vascular features - should not receive triptans in the future        Medication List     TAKE these medications    oxyCODONE -acetaminophen  7.5-325 MG tablet Commonly known as: PERCOCET Take 1 tablet by mouth every 3 (three) hours as needed for moderate pain (pain score 4-6).   polyethylene glycol 17 g packet Commonly known as: MIRALAX  / GLYCOLAX  Take 17 g by mouth 2 (two) times daily.   sildenafil 100 MG tablet Commonly known as: VIAGRA Take 100 mg by mouth as needed for erectile dysfunction.   tamsulosin  0.4 MG Caps capsule Commonly known as: FLOMAX  Take 0.4 mg by mouth in the morning and at bedtime.   zolpidem  10 MG tablet Commonly known as: AMBIEN  Take 10 mg by mouth at bedtime.       Allergies  Allergen Reactions   Hydromorphone Itching   Iodinated Contrast Media Anaphylaxis    Gadolinium    Triptans Other (See Comments)    Migraines with vascular features - should not receive triptans in the future    Follow-up Information     Maczis, Puja Gosai, PA-C. Schedule an appointment as soon as possible for a visit in 2 week(s).   Specialty: General Surgery Contact information: [address] [employer] [address] [facility] [place] [address] [phone]                  The results of significant diagnostics from this hospitalization (including imaging, microbiology, ancillary and laboratory) are listed below for reference.    Significant Diagnostic Studies: DG Cholangiogram Operative Result Date: 08/19/2024 CLINICAL DATA:  Cholecystectomy. EXAM: INTRAOPERATIVE CHOLANGIOGRAM TECHNIQUE: Cholangiographic images from the C-arm fluoroscopic device were submitted for interpretation post-operatively. Please see the procedural report for  the amount of contrast and the fluoroscopy time utilized. FLUOROSCOPY: Radiation Exposure Index (as provided by the fluoroscopic device): 3.9 mGy Kerma COMPARISON:  Right upper quadrant ultrasound 08/15/2024 FINDINGS: Intraoperative imaging with a C-arm demonstrates normal opacified cystic duct stump, common bile duct and visualized intrahepatic ducts. No evidence of biliary dilatation, stricture, filling defect or contrast extravasation. Contrast enters the duodenum normally. IMPRESSION: Normal intraoperative cholangiogram. Electronically Signed   By: Marcey Moan M.D.   On: 08/19/2024 08:11   NM Hepatobiliary Liver Func Result Date: 08/16/2024 EXAM: NM HEPATOBILLARY SCAN 08/16/2024 04:44:08 PM TECHNIQUE: RADIOPHARMACEUTICAL: 5 mCi Tc-27m mebrofenin Dynamic images of the abdomen and pelvis were obtained in the anterior projection for 1 hour after intravenous administration of radiopharmaceutical. Morphine  augmentation was attempted. Upon administration of morphine , the patient vomited and was unable to continue the study. Additional 30 minutes of imaging were performed. COMPARISON: None available. CLINICAL HISTORY: Cholecystitis. FINDINGS: Homogenous uptake within the liver. Normal clearance of the blood pool. Appropriate excretion into the biliary system. Activity is visualized in the small bowel. Gallbladder fails to fill over 90 minutes of imaging. This is concerning for acute cholecystitis. Morphine  augmentation was attempted. Upon administration of morphine , the  patient vomited and was unable to continue the study. Morphine  augmentation could not be completed due to nausea. IMPRESSION: 1. Non-filling of the gallbladder over 90 minutes is concerning for acute cholecystitis. 2. Morphine  augmentation was not completed due to nausea and vomiting. 3. These results will be called to the ordering clinician or representative by the radiologist assistant, and communication documented in the pacs or clario  dashboard Electronically signed by: Norleen Boxer MD 08/16/2024 05:09 PM EST RP Workstation: [id number]   CT Angio Chest/Abd/Pel for Dissection W and/or Wo Contrast Result Date: 08/15/2024 CLINICAL DATA:  Chest pressure radiating to neck, shortness of breath EXAM: CT ANGIOGRAPHY CHEST, ABDOMEN AND PELVIS TECHNIQUE: Non-contrast CT of the chest was initially obtained. Multidetector CT imaging through the chest, abdomen and pelvis was performed using the standard protocol during bolus administration of intravenous contrast. Multiplanar reconstructed images and MIPs were obtained and reviewed to evaluate the vascular anatomy. RADIATION DOSE REDUCTION: This exam was performed according to the departmental dose-optimization program which includes automated exposure control, adjustment of the mA and/or kV according to patient size and/or use of iterative reconstruction technique. CONTRAST:  75mL OMNIPAQUE  IOHEXOL  350 MG/ML SOLN COMPARISON:  12/01/2022 FINDINGS: CTA CHEST FINDINGS Cardiovascular: No evidence of thoracic aortic aneurysm or dissection. Mild cardiomegaly with trace pericardial effusion. Coronary artery atherosclerosis. There is technically adequate opacification of the pulmonary vasculature, with no filling defects or pulmonary emboli. Mediastinum/Nodes: Stable enlarged left lobe thyroid , with exophytic nodule off the lower pole left lobe measuring 3.5 x 2.3 cm unchanged. No pathologic adenopathy. Trachea and esophagus are unremarkable. Lungs/Pleura: Linear areas of consolidation at the lung bases consistent with atelectasis or scarring. No acute airspace disease, effusion, or pneumothorax. Central airways are patent. Musculoskeletal: No acute or destructive bony abnormalities. Reconstructed images demonstrate no additional findings. Review of the MIP images confirms the above findings. CTA ABDOMEN AND PELVIS FINDINGS VASCULAR Aorta: Normal caliber aorta without aneurysm, dissection, vasculitis or  significant stenosis. Mild atherosclerosis. Celiac: Patent without evidence of aneurysm, dissection, vasculitis or significant stenosis. SMA: Patent without evidence of aneurysm, dissection, vasculitis or significant stenosis. Renals: Both renal arteries are patent without evidence of aneurysm, dissection, vasculitis, fibromuscular dysplasia or significant stenosis. IMA: Patent without evidence of aneurysm, dissection, vasculitis or significant stenosis. Inflow: Patent without evidence of aneurysm, dissection, vasculitis or significant stenosis. Veins: No obvious venous abnormality within the limitations of this arterial phase study. Review of the MIP images confirms the above findings. NON-VASCULAR Hepatobiliary: Multiple calcified gallstones without evidence of acute cholecystitis. Liver is unremarkable. No biliary duct dilation or choledocholithiasis. Pancreas: Unremarkable. No pancreatic ductal dilatation or surrounding inflammatory changes. Spleen: Normal in size without focal abnormality. Adrenals/Urinary Tract: Nonobstructing left renal calculi, largest in the upper pole measuring 6 mm. Punctate 2 mm nonobstructing calculus lower pole right kidney. No obstructive uropathy within either kidney. The adrenals and bladder are unremarkable. Stomach/Bowel: No bowel obstruction or ileus. Normal appendix right lower quadrant. Diverticulosis of the descending and sigmoid colon without evidence of acute diverticulitis. No bowel wall thickening or inflammatory change. Lymphatic: No pathologic adenopathy. Reproductive: Stable marked enlargement of the prostate. Other: No free fluid or free intraperitoneal gas. No abdominal wall hernia. Musculoskeletal: No acute or destructive bony abnormalities. Reconstructed images demonstrate no additional findings. Review of the MIP images confirms the above findings. IMPRESSION: Vascular: 1. No evidence of thoracoabdominal aortic aneurysm or dissection. 2. No evidence of pulmonary  embolus. 3. Aortic Atherosclerosis (ICD10-I70.0). Coronary artery atherosclerosis. Nonvascular: 1. Mild cardiomegaly with trace pericardial effusion. 2.  Cholelithiasis without cholecystitis. 3. Distal colonic diverticulosis without diverticulitis. 4. Stable marked prostatomegaly. 5. Stable thyroid  goiter, with exophytic nodule off the left lobe unchanged. Electronically Signed   By: Ozell Daring M.D.   On: 08/15/2024 22:47   US  Abdomen Limited RUQ (LIVER/GB) Result Date: 08/15/2024 CLINICAL DATA:  Abdominal pain. EXAM: ULTRASOUND ABDOMEN LIMITED RIGHT UPPER QUADRANT COMPARISON:  CT abdomen pelvis dated 08/01/2023. FINDINGS: Gallbladder: The gallbladder is filled with stone and contracted. There is mild gallbladder wall thickening measuring 4-5 mm in thickness and may be partly related to underdistention. No pericholecystic fluid. Negative sonographic Murphy's sign. Common bile duct: Diameter: 8 mm Liver: The liver is unremarkable. Portal vein is patent on color Doppler imaging with normal direction of blood flow towards the liver. Other: None. IMPRESSION: Cholelithiasis without sonographic evidence of acute cholecystitis. A hepatobiliary scintigraphy may provide better evaluation of the gallbladder if there is a high clinical concern for acute cholecystitis . Electronically Signed   By: Vanetta Chou M.D.   On: 08/15/2024 17:46   DG Chest 2 View Result Date: 08/15/2024 EXAM: 2 VIEW(S) XRAY OF THE CHEST 08/15/2024 03:18:00 PM COMPARISON: 03/31/21 CLINICAL HISTORY: chest FINDINGS: LUNGS AND PLEURA: No focal pulmonary opacity. No pulmonary edema. No pleural effusion. No pneumothorax. HEART AND MEDIASTINUM: No acute abnormality of the cardiac and mediastinal silhouettes. BONES AND SOFT TISSUES: No acute osseous abnormality. IMPRESSION: 1. No acute cardiopulmonary process identified. Electronically signed by: Waddell Calk MD 08/15/2024 04:08 PM EST RP Workstation: [id number]    Microbiology: Recent Results  (from the past 240 hours)  Resp panel by RT-PCR (RSV, Flu A&B, Covid) Anterior Nasal Swab     Status: None   Collection Time: 08/15/24  3:44 PM   Specimen: Anterior Nasal Swab  Result Value Ref Range Status   SARS Coronavirus 2 by RT PCR NEGATIVE NEGATIVE Final    Comment: (NOTE) SARS-CoV-2 target nucleic acids are NOT DETECTED.  The SARS-CoV-2 RNA is generally detectable in upper respiratory specimens during the acute phase of infection. The lowest concentration of SARS-CoV-2 viral copies this assay can detect is 138 copies/mL. A negative result does not preclude SARS-Cov-2 infection and should not be used as the sole basis for treatment or other patient management decisions. A negative result may occur with  improper specimen collection/handling, submission of specimen other than nasopharyngeal swab, presence of viral mutation(s) within the areas targeted by this assay, and inadequate number of viral copies(<138 copies/mL). A negative result must be combined with clinical observations, patient history, and epidemiological information. The expected result is Negative.  Fact Sheet for Patients:  bloggercourse.com  Fact Sheet for Healthcare Providers:  seriousbroker.it  This test is no t yet approved or cleared by the [place]  FDA and  has been authorized for detection and/or diagnosis of SARS-CoV-2 by FDA under an Emergency Use Authorization (EUA). This EUA will remain  in effect (meaning this test can be used) for the duration of the COVID-19 declaration under Section 564(b)(1) of the Act, 21 U.S.C.section 360bbb-3(b)(1), unless the authorization is terminated  or revoked sooner.       Influenza A by PCR NEGATIVE NEGATIVE Final   Influenza B by PCR NEGATIVE NEGATIVE Final    Comment: (NOTE) The Xpert Xpress SARS-CoV-2/FLU/RSV plus assay is intended as an aid in the diagnosis of influenza from Nasopharyngeal swab  specimens and should not be used as a sole basis for treatment. Nasal washings and aspirates are unacceptable for Xpert Xpress SARS-CoV-2/FLU/RSV testing.  Fact Sheet for Patients:  bloggercourse.com  Fact Sheet for Healthcare Providers: seriousbroker.it  This test is not yet approved or cleared by the [place]  FDA and has been authorized for detection and/or diagnosis of SARS-CoV-2 by FDA under an Emergency Use Authorization (EUA). This EUA will remain in effect (meaning this test can be used) for the duration of the COVID-19 declaration under Section 564(b)(1) of the Act, 21 U.S.C. section 360bbb-3(b)(1), unless the authorization is terminated or revoked.     Resp Syncytial Virus by PCR NEGATIVE NEGATIVE Final    Comment: (NOTE) Fact Sheet for Patients: bloggercourse.com  Fact Sheet for Healthcare Providers: seriousbroker.it  This test is not yet approved or cleared by the [place]  FDA and has been authorized for detection and/or diagnosis of SARS-CoV-2 by FDA under an Emergency Use Authorization (EUA). This EUA will remain in effect (meaning this test can be used) for the duration of the COVID-19 declaration under Section 564(b)(1) of the Act, 21 U.S.C. section 360bbb-3(b)(1), unless the authorization is terminated or revoked.  Performed at [facility], [address], [place], [place] [address]      Labs: Basic Metabolic Panel: Recent Labs  Lab 08/15/24 1457 08/16/24 0206 08/17/24 0731 08/18/24 0519 08/19/24 0604  NA 141 137 137 134* 137  K 4.3 4.8 4.0 4.0 3.9  CL 107 108 104 98 101  CO2 25 19* 23 23 26   GLUCOSE 105* 138* 91 128* 108*  BUN 14 13 16 17 17   CREATININE 1.55* 1.46* 1.68* 1.78* 1.70*  CALCIUM 9.8 9.0 8.5* 8.7* 8.4*   Liver Function Tests: Recent Labs  Lab 08/15/24 1458 08/16/24 0206 08/17/24 0731 08/18/24 0519  08/19/24 0604  AST 24 24 16  41 33  ALT 15 21 17  46* 43  ALKPHOS 120 99 76 78 65  BILITOT 0.3 0.6 0.7 0.7 0.9  PROT 7.7 7.4 6.2* 6.8 6.0*  ALBUMIN 4.3 3.6 3.1* 3.3* 3.0*   Recent Labs  Lab 08/15/24 1458 08/16/24 0206 08/17/24 0731  LIPASE 32 28 23   No results for input(s): AMMONIA in the last 168 hours. CBC: Recent Labs  Lab 08/16/24 0206 08/17/24 0731 08/18/24 0519 08/19/24 0604 08/19/24 1110  WBC 5.5 5.2 8.3 6.4 6.7  NEUTROABS  --  2.2  --   --   --   HGB 13.6 11.8* 11.2* 9.1* 9.2*  HCT 44.6 38.3* 35.9* 28.7* 29.1*  MCV 77.0* 77.1* 75.7* 75.9* 76.0*  PLT 234 218 265 199 222   Cardiac Enzymes: No results for input(s): CKTOTAL, CKMB, CKMBINDEX, TROPONINI in the last 168 hours. BNP: BNP (last 3 results) No results for input(s): BNP in the last 8760 hours.  ProBNP (last 3 results) Recent Labs    08/15/24 1458  PROBNP <50.0    CBG: Recent Labs  Lab 08/16/24 0203  GLUCAP 133*    Signed:  Colen Grimes MD   [facility] 08/19/2024, 1:38 PM

## 2024-08-19 NOTE — Plan of Care (Signed)

## 2024-08-19 NOTE — Progress Notes (Signed)
 Discharge instructions given to pt. Pt verbalized understanding of all teaching and had no further questions

## 2024-08-19 NOTE — Progress Notes (Signed)
 Pt discharged to home with wife via wheelchair with all belongings and paperwork by transportation staff

## 2024-08-19 NOTE — Progress Notes (Signed)
 2 Days Post-Op   Subjective/Chief Complaint: Tol diet, no complaints   Objective: Vital signs in last 24 hours: Temp:  [98.1 F (36.7 C)-98.9 F (37.2 C)] 98.2 F (36.8 C) (11/10 0623) Pulse Rate:  [102-121] 102 (11/10 0623) Resp:  [16-18] 18 (11/10 0623) BP: (114-135)/(56-85) 117/56 (11/10 0623) SpO2:  [76 %-98 %] 93 % (11/10 0623) Last BM Date : 08/15/24  Intake/Output from previous day: 11/09 0701 - 11/10 0700 In: 240 [P.O.:240] Out: 200 [Urine:200] Intake/Output this shift: No intake/output data recorded.  Cv tachycardic Ab soft approp tender nondistended incisoins clean  Lab Results:  Recent Labs    08/18/24 0519 08/19/24 0604  WBC 8.3 6.4  HGB 11.2* 9.1*  HCT 35.9* 28.7*  PLT 265 199   BMET Recent Labs    08/18/24 0519 08/19/24 0604  NA 134* 137  K 4.0 3.9  CL 98 101  CO2 23 26  GLUCOSE 128* 108*  BUN 17 17  CREATININE 1.78* 1.70*  CALCIUM 8.7* 8.4*   PT/INR No results for input(s): LABPROT, INR in the last 72 hours. ABG No results for input(s): PHART, HCO3 in the last 72 hours.  Invalid input(s): PCO2, PO2  Studies/Results: DG Cholangiogram Operative Result Date: 08/19/2024 CLINICAL DATA:  Cholecystectomy. EXAM: INTRAOPERATIVE CHOLANGIOGRAM TECHNIQUE: Cholangiographic images from the C-arm fluoroscopic device were submitted for interpretation post-operatively. Please see the procedural report for the amount of contrast and the fluoroscopy time utilized. FLUOROSCOPY: Radiation Exposure Index (as provided by the fluoroscopic device): 3.9 mGy Kerma COMPARISON:  Right upper quadrant ultrasound 08/15/2024 FINDINGS: Intraoperative imaging with a C-arm demonstrates normal opacified cystic duct stump, common bile duct and visualized intrahepatic ducts. No evidence of biliary dilatation, stricture, filling defect or contrast extravasation. Contrast enters the duodenum normally. IMPRESSION: Normal intraoperative cholangiogram. Electronically  Signed   By: Marcey Moan M.D.   On: 08/19/2024 08:11    Anti-infectives: Anti-infectives (From admission, onward)    Start     Dose/Rate Route Frequency Ordered Stop   08/17/24 0900  cefTRIAXone  (ROCEPHIN ) 2 g in sodium chloride  0.9 % 100 mL IVPB        2 g 200 mL/hr over 30 Minutes Intravenous On call to O.R. 08/17/24 0809 08/17/24 1659       Assessment/Plan: POD 2 lap chole -doing better but tachy -hct lower will recheck -all dc stuff completed but would wait to see next hct  Samuel Patrick 08/19/2024

## 2024-08-20 LAB — SURGICAL PATHOLOGY

## 2024-08-27 NOTE — Anesthesia Postprocedure Evaluation (Signed)
 Anesthesia Post Note  Patient: Samuel Patrick  Procedure(s) Performed: LAPAROSCOPIC CHOLECYSTECTOMY WITH INTRAOPERATIVE CHOLANGIOGRAM INDOCYANINE GREEN FLUORESCENCE IMAGING (ICG) (Abdomen)     Patient location during evaluation: PACU Anesthesia Type: General Level of consciousness: awake and alert Pain management: pain level controlled Vital Signs Assessment: post-procedure vital signs reviewed and stable Respiratory status: spontaneous breathing, nonlabored ventilation and respiratory function stable Cardiovascular status: blood pressure returned to baseline and stable Postop Assessment: no apparent nausea or vomiting Anesthetic complications: no   No notable events documented.                  Jaide Hillenburg
